# Patient Record
Sex: Female | Born: 1955 | Race: Black or African American | Hispanic: No | Marital: Married | Smoking: Never smoker
Health system: Southern US, Community
[De-identification: ages and names within clinical notes are randomized; demographics above are authoritative.]

## PROBLEM LIST (undated history)

## (undated) DIAGNOSIS — E119 Type 2 diabetes mellitus without complications: Secondary | ICD-10-CM

## (undated) DIAGNOSIS — T7840XA Allergy, unspecified, initial encounter: Secondary | ICD-10-CM

## (undated) DIAGNOSIS — I10 Essential (primary) hypertension: Secondary | ICD-10-CM

## (undated) DIAGNOSIS — E785 Hyperlipidemia, unspecified: Secondary | ICD-10-CM

## (undated) DIAGNOSIS — I8390 Asymptomatic varicose veins of unspecified lower extremity: Secondary | ICD-10-CM

## (undated) DIAGNOSIS — H3500 Unspecified background retinopathy: Secondary | ICD-10-CM

## (undated) HISTORY — PX: CATARACT EXTRACTION: SUR2

## (undated) HISTORY — DX: Essential (primary) hypertension: I10

## (undated) HISTORY — DX: Type 2 diabetes mellitus without complications: E11.9

## (undated) HISTORY — DX: Hyperlipidemia, unspecified: E78.5

## (undated) HISTORY — DX: Unspecified background retinopathy: H35.00

## (undated) HISTORY — DX: Asymptomatic varicose veins of unspecified lower extremity: I83.90

## (undated) HISTORY — DX: Allergy, unspecified, initial encounter: T78.40XA

---

## 1978-08-06 HISTORY — PX: ABDOMINAL HYSTERECTOMY: SHX81

## 2002-08-06 HISTORY — PX: WRIST FRACTURE SURGERY: SHX121

## 2005-01-15 ENCOUNTER — Ambulatory Visit: Payer: Self-pay | Admitting: Internal Medicine

## 2007-04-10 ENCOUNTER — Ambulatory Visit: Payer: Self-pay | Admitting: Internal Medicine

## 2007-06-06 ENCOUNTER — Ambulatory Visit: Payer: Self-pay | Admitting: Gastroenterology

## 2007-08-07 LAB — HM COLONOSCOPY: HM Colonoscopy: NORMAL

## 2007-12-15 ENCOUNTER — Ambulatory Visit: Payer: Self-pay | Admitting: Family Medicine

## 2010-01-31 ENCOUNTER — Ambulatory Visit: Payer: Self-pay | Admitting: Family Medicine

## 2010-03-06 LAB — HM DEXA SCAN: HM Dexa Scan: NORMAL

## 2011-02-03 ENCOUNTER — Ambulatory Visit: Payer: Self-pay | Admitting: Otolaryngology

## 2011-02-28 ENCOUNTER — Ambulatory Visit: Payer: Self-pay | Admitting: Family Medicine

## 2012-02-29 ENCOUNTER — Ambulatory Visit: Payer: Self-pay | Admitting: Family Medicine

## 2013-01-01 ENCOUNTER — Other Ambulatory Visit: Payer: Self-pay | Admitting: Family Medicine

## 2013-01-01 LAB — LIPID PANEL
Cholesterol: 187 mg/dL (ref 0–200)
HDL Cholesterol: 50 mg/dL (ref 40–60)
Ldl Cholesterol, Calc: 118 mg/dL — ABNORMAL HIGH (ref 0–100)
Triglycerides: 97 mg/dL (ref 0–200)
VLDL Cholesterol, Calc: 19 mg/dL (ref 5–40)

## 2013-01-01 LAB — COMPREHENSIVE METABOLIC PANEL
Albumin: 3.7 g/dL (ref 3.4–5.0)
Alkaline Phosphatase: 77 U/L (ref 50–136)
Anion Gap: 6 — ABNORMAL LOW (ref 7–16)
BUN: 19 mg/dL — ABNORMAL HIGH (ref 7–18)
Bilirubin,Total: 0.3 mg/dL (ref 0.2–1.0)
Calcium, Total: 9.2 mg/dL (ref 8.5–10.1)
Chloride: 107 mmol/L (ref 98–107)
Co2: 28 mmol/L (ref 21–32)
Creatinine: 0.68 mg/dL (ref 0.60–1.30)
EGFR (African American): >60
EGFR (Non-African Amer.): >60
Glucose: 76 mg/dL (ref 65–99)
Osmolality: 282 (ref 275–301)
Potassium: 3.4 mmol/L — ABNORMAL LOW (ref 3.5–5.1)
SGOT(AST): 24 U/L (ref 15–37)
SGPT (ALT): 26 U/L (ref 12–78)
Sodium: 141 mmol/L (ref 136–145)
Total Protein: 7.2 g/dL (ref 6.4–8.2)

## 2013-02-04 ENCOUNTER — Other Ambulatory Visit: Payer: Self-pay | Admitting: *Deleted

## 2013-02-04 DIAGNOSIS — I8001 Phlebitis and thrombophlebitis of superficial vessels of right lower extremity: Secondary | ICD-10-CM

## 2013-03-23 ENCOUNTER — Encounter: Payer: Self-pay | Admitting: Vascular Surgery

## 2013-03-30 ENCOUNTER — Encounter: Payer: Self-pay | Admitting: Vascular Surgery

## 2013-04-07 ENCOUNTER — Ambulatory Visit: Payer: Self-pay | Admitting: Family Medicine

## 2013-04-07 ENCOUNTER — Other Ambulatory Visit: Payer: Self-pay | Admitting: *Deleted

## 2013-04-07 DIAGNOSIS — I8003 Phlebitis and thrombophlebitis of superficial vessels of lower extremities, bilateral: Secondary | ICD-10-CM

## 2013-04-29 ENCOUNTER — Ambulatory Visit (INDEPENDENT_AMBULATORY_CARE_PROVIDER_SITE_OTHER): Payer: No Typology Code available for payment source | Admitting: Vascular Surgery

## 2013-04-29 ENCOUNTER — Encounter: Payer: Self-pay | Admitting: Vascular Surgery

## 2013-04-29 VITALS — BP 111/60 | HR 80 | Temp 98.2°F | Resp 16 | Ht 65.0 in | Wt 183.0 lb

## 2013-04-29 DIAGNOSIS — M79609 Pain in unspecified limb: Secondary | ICD-10-CM

## 2013-04-29 DIAGNOSIS — M19011 Primary osteoarthritis, right shoulder: Secondary | ICD-10-CM | POA: Insufficient documentation

## 2013-04-29 NOTE — Progress Notes (Signed)
[facility] [place]  Patient name: Patricia Chan MRN: [id number] DOB: 05-13-1956 Sex: female  REASON FOR CONSULT: right lower extremity pain  HPI: Patricia Chan is a 57 y.o. female who fell at work and injured her right lower extremity on 12/05/12. Since that time she has had some pain along the medial aspect of her right distal thigh. She had a venous duplex scan on 01/22/2013 which showed no evidence of DVT. However she did have some phlebitis in a superficial vein adjacent to the right knee. A follow up study on 02/09/2013 again showed no evidence of DVT but some persistent phlebitis along the medial distal right thigh.  She denies any previous history of DVT or phlebitis. She has taken some ibuprofen for the pain which does help. Her symptoms have gradually improved over the last several weeks. She denies any significant leg swelling. He denies chest pain or shortness of breath.  Past Medical History  Diagnosis Date  . Diabetes mellitus without complication   . Retinopathy   . Varicose veins    Family History  Problem Relation Age of Onset  . Heart disease Brother   . Diabetes Other   . Kidney disease Other    SOCIAL HISTORY: History  Substance Use Topics  . Smoking status: Never Smoker   . Smokeless tobacco: Not on file  . Alcohol Use: No   No Known Allergies  Current Outpatient Prescriptions  Medication Sig Dispense Refill  . glyBURIDE (DIABETA) 2.5 MG tablet Take 1 tablet by mouth 2 (two) times daily at 8 am and 10 pm.      . losartan-hydrochlorothiazide (HYZAAR) 100-25 MG per tablet 1 tablet daily.       . metFORMIN (GLUCOPHAGE) 850 MG tablet Take 850 mg by mouth 2 (two) times daily with a meal.      . simvastatin (ZOCOR) 40 MG tablet Take 40 mg by mouth every evening.       No current facility-administered medications for this visit.   REVIEW OF SYSTEMS: Arly.Keller ] denotes positive finding; [  ] denotes negative finding  CARDIOVASCULAR:  [ ]  chest pain    [ ]  chest pressure   [ ]  palpitations   [ ]  orthopnea   [ ]  dyspnea on exertion   [ ]  claudication   [ ]  rest pain   [ ]  DVT   [ ]  phlebitis PULMONARY:   [ ]  productive cough   [ ]  asthma   [ ]  wheezing NEUROLOGIC:   [ ]  weakness  [ ]  paresthesias  [ ]  aphasia  [ ]  amaurosis  [ ]  dizziness HEMATOLOGIC:   [ ]  bleeding problems   [ ]  clotting disorders MUSCULOSKELETAL:  [ ]  joint pain   [ ]  joint swelling [ ]  leg swelling GASTROINTESTINAL: [ ]   blood in stool  [ ]   hematemesis GENITOURINARY:  [ ]   dysuria  [ ]   hematuria PSYCHIATRIC:  [ ]  history of major depression INTEGUMENTARY:  [ ]  rashes  [ ]  ulcers CONSTITUTIONAL:  [ ]  fever   [ ]  chills  PHYSICAL EXAM: Filed Vitals:   04/29/13 1015  BP: 111/60  Pulse: 80  Temp: 98.2 F (36.8 C)  TempSrc: Oral  Resp: 16  Height: 5\' 5"  (1.651 m)  Weight: 183 lb (83.008 kg)  SpO2: 99%   Body mass index is 30.45 kg/(m^2). GENERAL: The patient is a well-nourished female, in no acute distress. The vital signs are documented above. CARDIOVASCULAR: There is a  regular rate and rhythm. I do not detect carotid bruits. She has palpable femoral, popliteal, and pedal pulses bilaterally. PULMONARY: There is good air exchange bilaterally without wheezing or rales. ABDOMEN: Soft and non-tender with normal pitched bowel sounds.  MUSCULOSKELETAL: There are no major deformities or cyanosis. NEUROLOGIC: No focal weakness or paresthesias are detected. SKIN: she has a small varicose veins on the left leg. She has some tenderness along the course of the right greater saphenous vein the distal right thigh. PSYCHIATRIC: The patient has a normal affect.  DATA:  I reviewed her venous duplex scan from 01/22/2013 and 02/09/2013. Results are described above.  I have reviewed her records from the [facility]. Her medical history is otherwise fairly unremarkable.  MEDICAL ISSUES: This patient has resolving superficial thrombophlebitis of the right thigh. I do not  think any further workup is indicated. She'll continue to take ibuprofen as needed and to use warm compresses and leg elevation as needed. I'll be happy to see her back at any time if any new vascular issues arise.  DICKSON,CHRISTOPHER S [facility] [place] Beeper: [phone]

## 2013-05-08 ENCOUNTER — Encounter: Payer: Self-pay | Admitting: Family Medicine

## 2014-01-15 ENCOUNTER — Other Ambulatory Visit: Payer: Self-pay | Admitting: Family Medicine

## 2014-01-15 LAB — COMPREHENSIVE METABOLIC PANEL
ALBUMIN: 3.9 g/dL (ref 3.4–5.0)
ALT: 23 U/L (ref 12–78)
AST: 18 U/L (ref 15–37)
Alkaline Phosphatase: 67 U/L
Anion Gap: 8 (ref 7–16)
BUN: 22 mg/dL — ABNORMAL HIGH (ref 7–18)
Bilirubin,Total: 0.3 mg/dL (ref 0.2–1.0)
CALCIUM: 9.4 mg/dL (ref 8.5–10.1)
CO2: 29 mmol/L (ref 21–32)
CREATININE: 0.76 mg/dL (ref 0.60–1.30)
Chloride: 104 mmol/L (ref 98–107)
EGFR (Non-African Amer.): >60
GLUCOSE: 106 mg/dL — AB (ref 65–99)
Osmolality: 285 (ref 275–301)
Potassium: 3.7 mmol/L (ref 3.5–5.1)
SODIUM: 141 mmol/L (ref 136–145)
TOTAL PROTEIN: 7.1 g/dL (ref 6.4–8.2)

## 2014-01-15 LAB — LIPID PANEL
CHOLESTEROL: 153 mg/dL (ref 0–200)
Cholesterol: 153 mg/dL (ref 0–200)
HDL Cholesterol: 50 mg/dL (ref 40–60)
HDL: 50 mg/dL (ref 35–70)
LDL CALC: 88 mg/dL
Ldl Cholesterol, Calc: 88 mg/dL (ref 0–100)
Triglycerides: 77 mg/dL (ref 0–200)
Triglycerides: 77 mg/dL (ref 40–160)
VLDL CHOLESTEROL, CALC: 15 mg/dL (ref 5–40)

## 2014-04-27 ENCOUNTER — Ambulatory Visit: Payer: Self-pay | Admitting: Family Medicine

## 2014-04-27 LAB — HM MAMMOGRAPHY: HM MAMMO: NORMAL

## 2014-09-08 LAB — HEMOGLOBIN A1C: HEMOGLOBIN A1C: 7.4 % — AB (ref 4.0–6.0)

## 2015-01-11 ENCOUNTER — Ambulatory Visit (INDEPENDENT_AMBULATORY_CARE_PROVIDER_SITE_OTHER): Payer: Managed Care, Other (non HMO) | Admitting: Family Medicine

## 2015-01-11 ENCOUNTER — Encounter: Payer: Self-pay | Admitting: Family Medicine

## 2015-01-11 VITALS — BP 124/70 | HR 93 | Temp 97.8°F | Resp 14 | Ht 64.0 in | Wt 172.6 lb

## 2015-01-11 DIAGNOSIS — E785 Hyperlipidemia, unspecified: Secondary | ICD-10-CM

## 2015-01-11 DIAGNOSIS — E11319 Type 2 diabetes mellitus with unspecified diabetic retinopathy without macular edema: Secondary | ICD-10-CM | POA: Insufficient documentation

## 2015-01-11 DIAGNOSIS — F325 Major depressive disorder, single episode, in full remission: Secondary | ICD-10-CM | POA: Insufficient documentation

## 2015-01-11 DIAGNOSIS — I10 Essential (primary) hypertension: Secondary | ICD-10-CM | POA: Diagnosis not present

## 2015-01-11 DIAGNOSIS — E2839 Other primary ovarian failure: Secondary | ICD-10-CM | POA: Insufficient documentation

## 2015-01-11 DIAGNOSIS — E1169 Type 2 diabetes mellitus with other specified complication: Secondary | ICD-10-CM | POA: Insufficient documentation

## 2015-01-11 DIAGNOSIS — E08319 Diabetes mellitus due to underlying condition with unspecified diabetic retinopathy without macular edema: Secondary | ICD-10-CM

## 2015-01-11 DIAGNOSIS — Z862 Personal history of diseases of the blood and blood-forming organs and certain disorders involving the immune mechanism: Secondary | ICD-10-CM

## 2015-01-11 DIAGNOSIS — E119 Type 2 diabetes mellitus without complications: Secondary | ICD-10-CM | POA: Diagnosis not present

## 2015-01-11 DIAGNOSIS — F32A Depression, unspecified: Secondary | ICD-10-CM

## 2015-01-11 DIAGNOSIS — J3089 Other allergic rhinitis: Secondary | ICD-10-CM | POA: Insufficient documentation

## 2015-01-11 DIAGNOSIS — F320 Major depressive disorder, single episode, mild: Secondary | ICD-10-CM | POA: Insufficient documentation

## 2015-01-11 DIAGNOSIS — F329 Major depressive disorder, single episode, unspecified: Secondary | ICD-10-CM

## 2015-01-11 DIAGNOSIS — E663 Overweight: Secondary | ICD-10-CM | POA: Insufficient documentation

## 2015-01-11 DIAGNOSIS — M19019 Primary osteoarthritis, unspecified shoulder: Secondary | ICD-10-CM | POA: Insufficient documentation

## 2015-01-11 LAB — POCT UA - MICROALBUMIN: Microalbumin Ur, POC: 20 mg/L

## 2015-01-11 LAB — POCT GLYCOSYLATED HEMOGLOBIN (HGB A1C): HEMOGLOBIN A1C: 7.3

## 2015-01-11 MED ORDER — ATORVASTATIN CALCIUM 40 MG PO TABS: 40.0000 mg | ORAL_TABLET | Freq: Every day | ORAL | Status: DC

## 2015-01-11 MED ORDER — INSULIN DETEMIR 100 UNIT/ML ~~LOC~~ SOLN: 10.0000 [IU] | Freq: Every evening | SUBCUTANEOUS | Status: DC

## 2015-01-11 NOTE — Patient Instructions (Signed)
Hypoglycemia °Hypoglycemia occurs when the glucose in your blood is too low. Glucose is a type of sugar that is your body's main energy source. Hormones, such as insulin and glucagon, control the level of glucose in the blood. Insulin lowers blood glucose and glucagon increases blood glucose. Having too much insulin in your blood stream, or not eating enough food containing sugar, can result in hypoglycemia. Hypoglycemia can happen to people with or without diabetes. It can develop quickly and can be a medical emergency.  °CAUSES  °· Missing or delaying meals. °· Not eating enough carbohydrates at meals. °· Taking too much diabetes medicine. °· Not timing your oral diabetes medicine or insulin doses with meals, snacks, and exercise. °· Nausea and vomiting. °· Certain medicines. °· Severe illnesses, such as hepatitis, kidney disorders, and certain eating disorders. °· Increased activity or exercise without eating something extra or adjusting medicines. °· Drinking too much alcohol. °· A nerve disorder that affects body functions like your heart rate, blood pressure, and digestion (autonomic neuropathy). °· A condition where the stomach muscles do not function properly (gastroparesis). Therefore, medicines and food may not absorb properly. °· Rarely, a tumor of the pancreas can produce too much insulin. °SYMPTOMS  °· Hunger. °· Sweating (diaphoresis). °· Change in body temperature. °· Shakiness. °· Headache. °· Anxiety. °· Lightheadedness. °· Irritability. °· Difficulty concentrating. °· Dry mouth. °· Tingling or numbness in the hands or feet. °· Restless sleep or sleep disturbances. °· Altered speech and coordination. °· Change in mental status. °· Seizures or prolonged convulsions. °· Combativeness. °· Drowsiness (lethargic). °· Weakness. °· Increased heart rate or palpitations. °· Confusion. °· Pale, gray skin color. °· Blurred or double vision. °· Fainting. °DIAGNOSIS  °A physical exam and medical history will be  performed. Your caregiver may make a diagnosis based on your symptoms. Blood tests and other lab tests may be performed to confirm a diagnosis. Once the diagnosis is made, your caregiver will see if your signs and symptoms go away once your blood glucose is raised.  °TREATMENT  °Usually, you can easily treat your hypoglycemia when you notice symptoms. °· Check your blood glucose. If it is less than 70 mg/dl, take one of the following:   °¨ 3-4 glucose tablets.   °¨ ½ cup juice.   °¨ ½ cup regular soda.   °¨ 1 cup skim milk.   °¨ ½-1 tube of glucose gel.   °¨ 5-6 hard candies.   °· Avoid high-fat drinks or food that may delay a rise in blood glucose levels. °· Do not take more than the recommended amount of sugary foods, drinks, gel, or tablets. Doing so will cause your blood glucose to go too high.   °· Wait 10-15 minutes and recheck your blood glucose. If it is still less than 70 mg/dl or below your target range, repeat treatment.   °· Eat a snack if it is more than 1 hour until your next meal.   °There may be a time when your blood glucose may go so low that you are unable to treat yourself at home when you start to notice symptoms. You may need someone to help you. You may even faint or be unable to swallow. If you cannot treat yourself, someone will need to bring you to the hospital.  °HOME CARE INSTRUCTIONS °· If you have diabetes, follow your diabetes management plan by: °¨ Taking your medicines as directed. °¨ Following your exercise plan. °¨ Following your meal plan. Do not skip meals. Eat on time. °¨ Testing your blood   glucose regularly. Check your blood glucose before and after exercise. If you exercise longer or different than usual, be sure to check blood glucose more frequently. °¨ Wearing your medical alert jewelry that says you have diabetes. °· Identify the cause of your hypoglycemia. Then, develop ways to prevent the recurrence of hypoglycemia. °· Do not take a hot bath or shower right after an  insulin shot. °· Always carry treatment with you. Glucose tablets are the easiest to carry. °· If you are going to drink alcohol, drink it only with meals. °· Tell friends or family members ways to keep you safe during a seizure. This may include removing hard or sharp objects from the area or turning you on your side. °· Maintain a healthy weight. °SEEK MEDICAL CARE IF:  °· You are having problems keeping your blood glucose in your target range. °· You are having frequent episodes of hypoglycemia. °· You feel you might be having side effects from your medicines. °· You are not sure why your blood glucose is dropping so low. °· You notice a change in vision or a new problem with your vision. °SEEK IMMEDIATE MEDICAL CARE IF:  °· Confusion develops. °· A change in mental status occurs. °· The inability to swallow develops. °· Fainting occurs. °Document Released: 07/23/2005 Document Revised: 07/28/2013 Document Reviewed: 11/19/2011 °[employer]® Patient Information ©2015 [employer]. This information is not intended to replace advice given to you by your health care provider. Make sure you discuss any questions you have with your health care provider. ° °
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
## 2015-01-27 ENCOUNTER — Other Ambulatory Visit
Admission: RE | Admit: 2015-01-27 | Discharge: 2015-01-27 | Disposition: A | Discharge status: Home / Self Care | Payer: Managed Care, Other (non HMO) | Source: Ambulatory Visit | Attending: Family Medicine | Admitting: Family Medicine

## 2015-01-27 DIAGNOSIS — I10 Essential (primary) hypertension: Secondary | ICD-10-CM | POA: Diagnosis not present

## 2015-01-27 DIAGNOSIS — Z862 Personal history of diseases of the blood and blood-forming organs and certain disorders involving the immune mechanism: Secondary | ICD-10-CM | POA: Diagnosis not present

## 2015-01-27 DIAGNOSIS — E785 Hyperlipidemia, unspecified: Secondary | ICD-10-CM | POA: Diagnosis not present

## 2015-01-27 DIAGNOSIS — E08319 Diabetes mellitus due to underlying condition with unspecified diabetic retinopathy without macular edema: Secondary | ICD-10-CM | POA: Insufficient documentation

## 2015-01-27 LAB — COMPREHENSIVE METABOLIC PANEL
ALK PHOS: 65 U/L (ref 38–126)
ALT: 18 U/L (ref 14–54)
ANION GAP: 6 (ref 5–15)
AST: 18 U/L (ref 15–41)
Albumin: 4.4 g/dL (ref 3.5–5.0)
BUN: 24 mg/dL — ABNORMAL HIGH (ref 6–20)
CHLORIDE: 105 mmol/L (ref 101–111)
CO2: 29 mmol/L (ref 22–32)
Calcium: 9.4 mg/dL (ref 8.9–10.3)
Creatinine, Ser: 0.82 mg/dL (ref 0.44–1.00)
GFR calc Af Amer: >60 mL/min (ref >60)
GFR calc non Af Amer: >60 mL/min (ref >60)
GLUCOSE: 83 mg/dL (ref 65–99)
POTASSIUM: 3.5 mmol/L (ref 3.5–5.1)
SODIUM: 140 mmol/L (ref 135–145)
Total Bilirubin: 0.3 mg/dL (ref 0.3–1.2)
Total Protein: 7.5 g/dL (ref 6.5–8.1)

## 2015-02-16 ENCOUNTER — Other Ambulatory Visit: Payer: Self-pay | Admitting: Family Medicine

## 2015-02-16 NOTE — Telephone Encounter (Signed)
Patient requesting refill. 

## 2015-03-24 ENCOUNTER — Other Ambulatory Visit: Payer: Self-pay | Admitting: Family Medicine

## 2015-04-08 ENCOUNTER — Other Ambulatory Visit: Payer: Self-pay | Admitting: Family Medicine

## 2015-04-08 NOTE — Telephone Encounter (Signed)
Patient requesting refill. 

## 2015-04-11 ENCOUNTER — Other Ambulatory Visit: Payer: Self-pay | Admitting: Family Medicine

## 2015-04-14 ENCOUNTER — Encounter: Payer: Self-pay | Admitting: Family Medicine

## 2015-04-14 ENCOUNTER — Ambulatory Visit (INDEPENDENT_AMBULATORY_CARE_PROVIDER_SITE_OTHER): Payer: Managed Care, Other (non HMO) | Admitting: Family Medicine

## 2015-04-14 VITALS — BP 128/68 | HR 84 | Temp 97.7°F | Resp 16 | Ht 64.0 in | Wt 170.2 lb

## 2015-04-14 DIAGNOSIS — M25569 Pain in unspecified knee: Secondary | ICD-10-CM

## 2015-04-14 DIAGNOSIS — I10 Essential (primary) hypertension: Secondary | ICD-10-CM

## 2015-04-14 DIAGNOSIS — Z862 Personal history of diseases of the blood and blood-forming organs and certain disorders involving the immune mechanism: Secondary | ICD-10-CM | POA: Diagnosis not present

## 2015-04-14 DIAGNOSIS — E11329 Type 2 diabetes mellitus with mild nonproliferative diabetic retinopathy without macular edema: Secondary | ICD-10-CM | POA: Diagnosis not present

## 2015-04-14 DIAGNOSIS — E113299 Type 2 diabetes mellitus with mild nonproliferative diabetic retinopathy without macular edema, unspecified eye: Secondary | ICD-10-CM

## 2015-04-14 DIAGNOSIS — Z23 Encounter for immunization: Secondary | ICD-10-CM

## 2015-04-14 DIAGNOSIS — H6123 Impacted cerumen, bilateral: Secondary | ICD-10-CM | POA: Diagnosis not present

## 2015-04-14 DIAGNOSIS — E785 Hyperlipidemia, unspecified: Secondary | ICD-10-CM | POA: Diagnosis not present

## 2015-04-14 DIAGNOSIS — J309 Allergic rhinitis, unspecified: Secondary | ICD-10-CM

## 2015-04-14 DIAGNOSIS — G8929 Other chronic pain: Secondary | ICD-10-CM | POA: Diagnosis not present

## 2015-04-14 DIAGNOSIS — M25562 Pain in left knee: Secondary | ICD-10-CM | POA: Diagnosis not present

## 2015-04-14 DIAGNOSIS — J3089 Other allergic rhinitis: Secondary | ICD-10-CM

## 2015-04-14 LAB — POCT GLYCOSYLATED HEMOGLOBIN (HGB A1C): Hemoglobin A1C: 7

## 2015-04-14 MED ORDER — GLYBURIDE 2.5 MG PO TABS
2.5000 mg | ORAL_TABLET | Freq: Two times a day (BID) | ORAL | Status: DC
Start: 2015-04-14 — End: 2015-08-23

## 2015-04-14 MED ORDER — ACETAMINOPHEN 500 MG PO TABS: 500.0000 mg | ORAL_TABLET | Freq: Three times a day (TID) | ORAL | Status: AC | PRN

## 2015-04-14 MED ORDER — CANAGLIFLOZIN-METFORMIN HCL 150-1000 MG PO TABS
1.0000 | ORAL_TABLET | Freq: Two times a day (BID) | ORAL | Status: DC
Start: 2015-04-14 — End: 2015-05-16

## 2015-04-14 MED ORDER — INSULIN DETEMIR 100 UNIT/ML ~~LOC~~ SOLN: 10.0000 [IU] | Freq: Every evening | SUBCUTANEOUS | Status: DC

## 2015-04-14 MED ORDER — ATORVASTATIN CALCIUM 40 MG PO TABS
40.0000 mg | ORAL_TABLET | Freq: Every day | ORAL | Status: DC
Start: 2015-04-14 — End: 2015-08-23

## 2015-04-14 MED ORDER — LORATADINE 10 MG PO TABS: 10.0000 mg | ORAL_TABLET | Freq: Every day | ORAL | Status: DC

## 2015-04-14 MED ORDER — FLUTICASONE PROPIONATE 50 MCG/ACT NA SUSP: 2.0000 | Freq: Every day | NASAL | Status: DC

## 2015-04-14 MED ORDER — LOSARTAN POTASSIUM-HCTZ 100-25 MG PO TABS: 1.0000 | ORAL_TABLET | Freq: Every morning | ORAL | Status: DC
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
## 2015-04-21 ENCOUNTER — Other Ambulatory Visit: Payer: Self-pay | Admitting: Family Medicine

## 2015-04-21 NOTE — Telephone Encounter (Signed)
Patient requesting refill. 

## 2015-05-11 ENCOUNTER — Other Ambulatory Visit: Payer: Self-pay | Admitting: Family Medicine

## 2015-05-12 NOTE — Telephone Encounter (Signed)
Appointment made for monday

## 2015-05-16 ENCOUNTER — Encounter: Payer: Self-pay | Admitting: Family Medicine

## 2015-05-16 ENCOUNTER — Ambulatory Visit
Admission: RE | Admit: 2015-05-16 | Discharge: 2015-05-16 | Disposition: A | Discharge status: Home / Self Care | Payer: Managed Care, Other (non HMO) | Source: Ambulatory Visit | Attending: Family Medicine | Admitting: Family Medicine

## 2015-05-16 ENCOUNTER — Ambulatory Visit (INDEPENDENT_AMBULATORY_CARE_PROVIDER_SITE_OTHER): Payer: Managed Care, Other (non HMO) | Admitting: Family Medicine

## 2015-05-16 VITALS — BP 114/62 | HR 99 | Temp 98.1°F | Resp 18 | Ht 64.0 in | Wt 169.6 lb

## 2015-05-16 DIAGNOSIS — M25562 Pain in left knee: Principal | ICD-10-CM

## 2015-05-16 DIAGNOSIS — G8929 Other chronic pain: Secondary | ICD-10-CM | POA: Insufficient documentation

## 2015-05-16 DIAGNOSIS — M1712 Unilateral primary osteoarthritis, left knee: Secondary | ICD-10-CM | POA: Diagnosis not present

## 2015-05-16 DIAGNOSIS — F320 Major depressive disorder, single episode, mild: Secondary | ICD-10-CM | POA: Diagnosis not present

## 2015-05-16 MED ORDER — TRAMADOL HCL 50 MG PO TABS: 50.0000 mg | ORAL_TABLET | Freq: Every evening | ORAL | Status: DC | PRN
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
## 2015-05-20 ENCOUNTER — Other Ambulatory Visit
Admission: RE | Admit: 2015-05-20 | Discharge: 2015-05-20 | Disposition: A | Discharge status: Home / Self Care | Payer: Managed Care, Other (non HMO) | Source: Ambulatory Visit | Attending: Family Medicine | Admitting: Family Medicine

## 2015-05-20 DIAGNOSIS — M25562 Pain in left knee: Secondary | ICD-10-CM | POA: Insufficient documentation

## 2015-05-20 LAB — LIPID PANEL
CHOL/HDL RATIO: 3.2 ratio
CHOLESTEROL: 150 mg/dL (ref 0–200)
HDL: 47 mg/dL (ref >40)
LDL CALC: 90 mg/dL (ref 0–99)
TRIGLYCERIDES: 63 mg/dL (ref <150)
VLDL: 13 mg/dL (ref 0–40)

## 2015-05-20 LAB — COMPREHENSIVE METABOLIC PANEL
ALBUMIN: 4.7 g/dL (ref 3.5–5.0)
ALK PHOS: 58 U/L (ref 38–126)
ALT: 23 U/L (ref 14–54)
ANION GAP: 9 (ref 5–15)
AST: 18 U/L (ref 15–41)
BUN: 25 mg/dL — ABNORMAL HIGH (ref 6–20)
CALCIUM: 9.8 mg/dL (ref 8.9–10.3)
CHLORIDE: 104 mmol/L (ref 101–111)
CO2: 28 mmol/L (ref 22–32)
Creatinine, Ser: 0.72 mg/dL (ref 0.44–1.00)
GFR calc non Af Amer: >60 mL/min (ref >60)
GLUCOSE: 125 mg/dL — AB (ref 65–99)
Potassium: 3.6 mmol/L (ref 3.5–5.1)
SODIUM: 141 mmol/L (ref 135–145)
Total Bilirubin: 0.3 mg/dL (ref 0.3–1.2)
Total Protein: 7.6 g/dL (ref 6.5–8.1)

## 2015-05-20 LAB — CBC WITH DIFFERENTIAL/PLATELET
BASOS ABS: 0.1 10*3/uL (ref 0–0.1)
BASOS PCT: 1 %
EOS ABS: 0.4 10*3/uL (ref 0–0.7)
EOS PCT: 7 %
HCT: 35.6 % (ref 35.0–47.0)
Hemoglobin: 12.1 g/dL (ref 12.0–16.0)
Lymphocytes Relative: 24 %
Lymphs Abs: 1.4 10*3/uL (ref 1.0–3.6)
MCH: 29.9 pg (ref 26.0–34.0)
MCHC: 34.1 g/dL (ref 32.0–36.0)
MCV: 87.8 fL (ref 80.0–100.0)
MONO ABS: 0.5 10*3/uL (ref 0.2–0.9)
Monocytes Relative: 9 %
Neutro Abs: 3.5 10*3/uL (ref 1.4–6.5)
Neutrophils Relative %: 59 %
PLATELETS: 302 10*3/uL (ref 150–440)
RBC: 4.05 MIL/uL (ref 3.80–5.20)
RDW: 14.2 % (ref 11.5–14.5)
WBC: 5.8 10*3/uL (ref 3.6–11.0)

## 2015-05-20 LAB — HEMOGLOBIN A1C: Hgb A1c MFr Bld: 7 % — ABNORMAL HIGH (ref 4.0–6.0)

## 2015-06-03 ENCOUNTER — Other Ambulatory Visit: Payer: Self-pay | Admitting: Family Medicine

## 2015-06-03 NOTE — Telephone Encounter (Signed)
Patient requesting refill. 

## 2015-07-28 ENCOUNTER — Ambulatory Visit (INDEPENDENT_AMBULATORY_CARE_PROVIDER_SITE_OTHER): Payer: 59 | Admitting: Family Medicine

## 2015-07-28 ENCOUNTER — Encounter: Payer: Self-pay | Admitting: Family Medicine

## 2015-07-28 VITALS — BP 112/66 | HR 89 | Temp 98.0°F | Resp 18 | Wt 176.0 lb

## 2015-07-28 DIAGNOSIS — Z794 Long term (current) use of insulin: Secondary | ICD-10-CM

## 2015-07-28 DIAGNOSIS — G8929 Other chronic pain: Secondary | ICD-10-CM | POA: Diagnosis not present

## 2015-07-28 DIAGNOSIS — E118 Type 2 diabetes mellitus with unspecified complications: Secondary | ICD-10-CM | POA: Diagnosis not present

## 2015-07-28 DIAGNOSIS — M25562 Pain in left knee: Secondary | ICD-10-CM

## 2015-07-28 DIAGNOSIS — B3731 Acute candidiasis of vulva and vagina: Secondary | ICD-10-CM

## 2015-07-28 DIAGNOSIS — M1712 Unilateral primary osteoarthritis, left knee: Secondary | ICD-10-CM | POA: Diagnosis not present

## 2015-07-28 DIAGNOSIS — N907 Vulvar cyst: Secondary | ICD-10-CM | POA: Diagnosis not present

## 2015-07-28 DIAGNOSIS — B373 Candidiasis of vulva and vagina: Secondary | ICD-10-CM

## 2015-07-28 DIAGNOSIS — Z23 Encounter for immunization: Secondary | ICD-10-CM

## 2015-07-28 MED ORDER — TRIAMCINOLONE ACETONIDE 40 MG/ML IJ SUSP
40.0000 mg | Freq: Once | INTRAMUSCULAR | Status: AC
  Administered 2015-07-28: 40 mg via INTRAMUSCULAR

## 2015-07-28 MED ORDER — FLUCONAZOLE 150 MG PO TABS: 150.0000 mg | ORAL_TABLET | ORAL | Status: DC

## 2015-07-28 MED ORDER — LIDOCAINE HCL (PF) 1 % IJ SOLN
2.0000 mL | Freq: Once | INTRAMUSCULAR | Status: AC
  Administered 2015-07-28: 2 mL via INTRADERMAL
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
## 2015-08-12 ENCOUNTER — Telehealth: Payer: Self-pay

## 2015-08-12 NOTE — Telephone Encounter (Signed)
Patient will be in on 08/23/15 at 3pm and is interested in the Levemir samples. She stated that if we get any in before then to save her 2 boxes.

## 2015-08-15 ENCOUNTER — Ambulatory Visit: Payer: 59 | Admitting: Family Medicine

## 2015-08-18 ENCOUNTER — Telehealth: Payer: Self-pay | Admitting: Family Medicine

## 2015-08-18 NOTE — Telephone Encounter (Signed)
Pens are in the fridge waiting for her to pick it up.

## 2015-08-18 NOTE — Telephone Encounter (Signed)
Patient will be completely out of Levimer. Was told that when we received either the levimer pen or the valves that we would save it for her. She will need a prescription for the needles if we give her the valves. Would like to pick this up today around 2p

## 2015-08-18 NOTE — Telephone Encounter (Signed)
Patient informed and will pick it up this afternoon

## 2015-08-23 ENCOUNTER — Encounter: Payer: Self-pay | Admitting: Family Medicine

## 2015-08-23 ENCOUNTER — Ambulatory Visit (INDEPENDENT_AMBULATORY_CARE_PROVIDER_SITE_OTHER): Payer: BLUE CROSS/BLUE SHIELD | Admitting: Family Medicine

## 2015-08-23 VITALS — BP 114/70 | HR 91 | Temp 97.9°F | Resp 16 | Wt 178.1 lb

## 2015-08-23 DIAGNOSIS — E113219 Type 2 diabetes mellitus with mild nonproliferative diabetic retinopathy with macular edema, unspecified eye: Secondary | ICD-10-CM | POA: Diagnosis not present

## 2015-08-23 DIAGNOSIS — E113299 Type 2 diabetes mellitus with mild nonproliferative diabetic retinopathy without macular edema, unspecified eye: Secondary | ICD-10-CM | POA: Diagnosis not present

## 2015-08-23 DIAGNOSIS — I10 Essential (primary) hypertension: Secondary | ICD-10-CM | POA: Diagnosis not present

## 2015-08-23 DIAGNOSIS — E1165 Type 2 diabetes mellitus with hyperglycemia: Secondary | ICD-10-CM | POA: Diagnosis not present

## 2015-08-23 DIAGNOSIS — E785 Hyperlipidemia, unspecified: Secondary | ICD-10-CM | POA: Diagnosis not present

## 2015-08-23 DIAGNOSIS — J309 Allergic rhinitis, unspecified: Secondary | ICD-10-CM

## 2015-08-23 DIAGNOSIS — Z794 Long term (current) use of insulin: Secondary | ICD-10-CM

## 2015-08-23 DIAGNOSIS — IMO0002 Reserved for concepts with insufficient information to code with codable children: Secondary | ICD-10-CM

## 2015-08-23 DIAGNOSIS — J3089 Other allergic rhinitis: Secondary | ICD-10-CM

## 2015-08-23 LAB — POCT UA - MICROALBUMIN: Microalbumin Ur, POC: 20 mg/L

## 2015-08-23 LAB — POCT GLYCOSYLATED HEMOGLOBIN (HGB A1C): HEMOGLOBIN A1C: 7.8

## 2015-08-23 MED ORDER — INSULIN DETEMIR 100 UNIT/ML FLEXPEN: 10.0000 [IU] | PEN_INJECTOR | Freq: Every day | SUBCUTANEOUS | Status: DC

## 2015-08-23 MED ORDER — FLUTICASONE PROPIONATE 50 MCG/ACT NA SUSP: 2.0000 | Freq: Every day | NASAL | Status: DC

## 2015-08-23 MED ORDER — LOSARTAN POTASSIUM-HCTZ 100-25 MG PO TABS: 1.0000 | ORAL_TABLET | Freq: Every morning | ORAL | Status: DC

## 2015-08-23 MED ORDER — ATORVASTATIN CALCIUM 40 MG PO TABS: 40.0000 mg | ORAL_TABLET | Freq: Every day | ORAL | Status: DC

## 2015-08-23 MED ORDER — GLIPIZIDE 5 MG PO TABS: 2.5000 mg | ORAL_TABLET | Freq: Two times a day (BID) | ORAL | Status: DC

## 2015-08-23 MED ORDER — CANAGLIFLOZIN-METFORMIN HCL 150-1000 MG PO TABS: 1.0000 | ORAL_TABLET | Freq: Two times a day (BID) | ORAL | Status: DC
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
## 2015-10-19 ENCOUNTER — Other Ambulatory Visit: Payer: Self-pay

## 2015-10-19 DIAGNOSIS — E113299 Type 2 diabetes mellitus with mild nonproliferative diabetic retinopathy without macular edema, unspecified eye: Secondary | ICD-10-CM

## 2015-10-19 NOTE — Telephone Encounter (Signed)
Got a fax from [facility] stating that the rx for Invokamet 150-1000mg  was filled on 10/18/2015.

## 2015-11-22 ENCOUNTER — Encounter: Payer: Self-pay | Admitting: Family Medicine

## 2015-11-22 ENCOUNTER — Ambulatory Visit (INDEPENDENT_AMBULATORY_CARE_PROVIDER_SITE_OTHER): Payer: BLUE CROSS/BLUE SHIELD | Admitting: Family Medicine

## 2015-11-22 VITALS — HR 96 | Temp 98.0°F | Resp 16 | Wt 177.7 lb

## 2015-11-22 DIAGNOSIS — G8929 Other chronic pain: Secondary | ICD-10-CM

## 2015-11-22 DIAGNOSIS — M25562 Pain in left knee: Secondary | ICD-10-CM

## 2015-11-22 DIAGNOSIS — Z794 Long term (current) use of insulin: Secondary | ICD-10-CM | POA: Diagnosis not present

## 2015-11-22 DIAGNOSIS — I10 Essential (primary) hypertension: Secondary | ICD-10-CM | POA: Diagnosis not present

## 2015-11-22 DIAGNOSIS — J309 Allergic rhinitis, unspecified: Secondary | ICD-10-CM | POA: Diagnosis not present

## 2015-11-22 DIAGNOSIS — M1712 Unilateral primary osteoarthritis, left knee: Secondary | ICD-10-CM

## 2015-11-22 DIAGNOSIS — E113219 Type 2 diabetes mellitus with mild nonproliferative diabetic retinopathy with macular edema, unspecified eye: Secondary | ICD-10-CM | POA: Diagnosis not present

## 2015-11-22 DIAGNOSIS — J3089 Other allergic rhinitis: Secondary | ICD-10-CM

## 2015-11-22 LAB — POCT GLYCOSYLATED HEMOGLOBIN (HGB A1C): HEMOGLOBIN A1C: 8.2

## 2015-11-22 MED ORDER — DULAGLUTIDE 1.5 MG/0.5ML ~~LOC~~ SOAJ: 1.5000 mg | SUBCUTANEOUS | Status: DC

## 2015-11-22 MED ORDER — CANAGLIFLOZIN-METFORMIN HCL 150-1000 MG PO TABS: 1.0000 | ORAL_TABLET | Freq: Two times a day (BID) | ORAL | Status: DC

## 2015-11-22 MED ORDER — INSULIN DEGLUDEC 200 UNIT/ML ~~LOC~~ SOPN: 12.0000 [IU] | PEN_INJECTOR | Freq: Every day | SUBCUTANEOUS | Status: DC
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
## 2015-12-05 ENCOUNTER — Telehealth: Payer: Self-pay | Admitting: Family Medicine

## 2015-12-05 NOTE — Telephone Encounter (Signed)
Pt would like a call back by the end of the day. Pt states its very important.

## 2015-12-06 NOTE — Telephone Encounter (Signed)
Tiffany spoke with the patient and she will come by to get a sample until she can get her meds.

## 2015-12-09 ENCOUNTER — Telehealth: Payer: Self-pay

## 2015-12-09 NOTE — Telephone Encounter (Signed)
[employer] Pre-Authorization for Trulicity was denied due to Insurance wanting patient to try Victoza first.

## 2015-12-11 MED ORDER — LIRAGLUTIDE 18 MG/3ML ~~LOC~~ SOPN: 1.2000 mg | PEN_INJECTOR | Freq: Every day | SUBCUTANEOUS | Status: DC

## 2015-12-11 NOTE — Telephone Encounter (Signed)
Change to Victoza,

## 2015-12-12 NOTE — Telephone Encounter (Signed)
Patient notified

## 2015-12-23 ENCOUNTER — Encounter: Payer: Self-pay | Admitting: Family Medicine

## 2015-12-23 ENCOUNTER — Ambulatory Visit (INDEPENDENT_AMBULATORY_CARE_PROVIDER_SITE_OTHER): Payer: BLUE CROSS/BLUE SHIELD | Admitting: Family Medicine

## 2015-12-23 VITALS — BP 112/76 | HR 101 | Temp 97.5°F | Resp 16 | Ht 64.0 in | Wt 172.8 lb

## 2015-12-23 DIAGNOSIS — E113219 Type 2 diabetes mellitus with mild nonproliferative diabetic retinopathy with macular edema, unspecified eye: Secondary | ICD-10-CM

## 2015-12-23 DIAGNOSIS — L309 Dermatitis, unspecified: Secondary | ICD-10-CM | POA: Diagnosis not present

## 2015-12-23 DIAGNOSIS — Z794 Long term (current) use of insulin: Secondary | ICD-10-CM | POA: Diagnosis not present

## 2015-12-23 MED ORDER — INSULIN DEGLUDEC-LIRAGLUTIDE 100-3.6 UNIT-MG/ML ~~LOC~~ SOPN: 16.0000 [IU] | PEN_INJECTOR | Freq: Every day | SUBCUTANEOUS | Status: DC

## 2015-12-23 MED ORDER — TRIAMCINOLONE ACETONIDE 0.1 % EX CREA: 1.0000 "application " | TOPICAL_CREAM | Freq: Two times a day (BID) | CUTANEOUS | Status: DC
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
## 2016-01-24 ENCOUNTER — Other Ambulatory Visit: Payer: Self-pay

## 2016-01-24 ENCOUNTER — Telehealth: Payer: Self-pay | Admitting: Family Medicine

## 2016-01-24 DIAGNOSIS — E785 Hyperlipidemia, unspecified: Secondary | ICD-10-CM

## 2016-01-24 MED ORDER — ATORVASTATIN CALCIUM 40 MG PO TABS: 40.0000 mg | ORAL_TABLET | Freq: Every day | ORAL | Status: DC

## 2016-01-24 NOTE — Telephone Encounter (Signed)
One box only

## 2016-01-24 NOTE — Telephone Encounter (Signed)
Would like to know if we have any samples of xultophy. She is about out and does not have the funds this week to request a prescription. Would like to pick up this afternoon

## 2016-01-24 NOTE — Telephone Encounter (Signed)
Refill request was sent to Dr. Krichna Sowles for approval and submission.  

## 2016-01-24 NOTE — Telephone Encounter (Signed)
We have 6 boxes, how many would you like her to have?

## 2016-01-27 ENCOUNTER — Telehealth: Payer: Self-pay | Admitting: Family Medicine

## 2016-01-27 DIAGNOSIS — I10 Essential (primary) hypertension: Secondary | ICD-10-CM

## 2016-01-27 NOTE — Telephone Encounter (Signed)
Patient requesting refill. 

## 2016-01-27 NOTE — Telephone Encounter (Signed)
Pt states she needs refill on Losartan to be sent to [facility].

## 2016-01-28 MED ORDER — LOSARTAN POTASSIUM-HCTZ 100-25 MG PO TABS
1.0000 | ORAL_TABLET | Freq: Every morning | ORAL | Status: DC
Start: 2016-01-28 — End: 2016-08-07

## 2016-02-20 ENCOUNTER — Other Ambulatory Visit: Payer: Self-pay

## 2016-02-20 DIAGNOSIS — E113219 Type 2 diabetes mellitus with mild nonproliferative diabetic retinopathy with macular edema, unspecified eye: Secondary | ICD-10-CM

## 2016-02-20 DIAGNOSIS — Z794 Long term (current) use of insulin: Principal | ICD-10-CM

## 2016-02-20 MED ORDER — INSULIN DEGLUDEC-LIRAGLUTIDE 100-3.6 UNIT-MG/ML ~~LOC~~ SOPN: 16.0000 [IU] | PEN_INJECTOR | Freq: Every day | SUBCUTANEOUS | Status: DC

## 2016-02-20 NOTE — Telephone Encounter (Signed)
Patient stated she needs a new prescription sent to [facility] since she was finally able to get the coupon card to go through.

## 2016-02-22 ENCOUNTER — Ambulatory Visit: Payer: BLUE CROSS/BLUE SHIELD | Admitting: Family Medicine

## 2016-03-05 ENCOUNTER — Ambulatory Visit (INDEPENDENT_AMBULATORY_CARE_PROVIDER_SITE_OTHER): Payer: BLUE CROSS/BLUE SHIELD | Admitting: Family Medicine

## 2016-03-05 ENCOUNTER — Encounter: Payer: Self-pay | Admitting: Family Medicine

## 2016-03-05 VITALS — BP 114/64 | HR 98 | Temp 97.8°F | Resp 18 | Ht 64.0 in | Wt 174.8 lb

## 2016-03-05 DIAGNOSIS — J309 Allergic rhinitis, unspecified: Secondary | ICD-10-CM | POA: Diagnosis not present

## 2016-03-05 DIAGNOSIS — E785 Hyperlipidemia, unspecified: Secondary | ICD-10-CM | POA: Diagnosis not present

## 2016-03-05 DIAGNOSIS — I10 Essential (primary) hypertension: Secondary | ICD-10-CM

## 2016-03-05 DIAGNOSIS — Z794 Long term (current) use of insulin: Secondary | ICD-10-CM

## 2016-03-05 DIAGNOSIS — R0683 Snoring: Secondary | ICD-10-CM | POA: Diagnosis not present

## 2016-03-05 DIAGNOSIS — E113219 Type 2 diabetes mellitus with mild nonproliferative diabetic retinopathy with macular edema, unspecified eye: Secondary | ICD-10-CM | POA: Diagnosis not present

## 2016-03-05 DIAGNOSIS — J3089 Other allergic rhinitis: Secondary | ICD-10-CM

## 2016-03-05 LAB — POCT GLYCOSYLATED HEMOGLOBIN (HGB A1C): HEMOGLOBIN A1C: 8

## 2016-03-05 MED ORDER — ATORVASTATIN CALCIUM 40 MG PO TABS: 40.0000 mg | ORAL_TABLET | Freq: Every day | ORAL | 0 refills | Status: DC

## 2016-03-05 MED ORDER — DAPAGLIFLOZIN PRO-METFORMIN ER 10-1000 MG PO TB24: 1.0000 | ORAL_TABLET | Freq: Every day | ORAL | 3 refills | Status: DC

## 2016-03-05 NOTE — Patient Instructions (Signed)
Go up by 2 units of Xultophy every 2 days to keep fasting sugar between 90-120's
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
## 2016-03-07 ENCOUNTER — Encounter: Payer: Self-pay | Admitting: Family Medicine

## 2016-03-20 ENCOUNTER — Encounter: Payer: BLUE CROSS/BLUE SHIELD | Admitting: Family Medicine

## 2016-03-22 ENCOUNTER — Encounter: Payer: Self-pay | Admitting: Family Medicine

## 2016-05-01 ENCOUNTER — Other Ambulatory Visit: Payer: Self-pay | Admitting: Family Medicine

## 2016-05-01 DIAGNOSIS — E785 Hyperlipidemia, unspecified: Secondary | ICD-10-CM

## 2016-05-01 NOTE — Telephone Encounter (Signed)
Patient requesting refill of Atorvastatin.   

## 2016-05-28 ENCOUNTER — Other Ambulatory Visit: Payer: Self-pay | Admitting: Family Medicine

## 2016-05-28 DIAGNOSIS — E113219 Type 2 diabetes mellitus with mild nonproliferative diabetic retinopathy with macular edema, unspecified eye: Secondary | ICD-10-CM

## 2016-05-28 DIAGNOSIS — Z794 Long term (current) use of insulin: Principal | ICD-10-CM

## 2016-05-28 NOTE — Telephone Encounter (Signed)
Patient requesting refill of Xultophy to [facility].

## 2016-05-30 ENCOUNTER — Telehealth: Payer: Self-pay | Admitting: Family Medicine

## 2016-05-30 NOTE — Telephone Encounter (Signed)
Please verify . Not sure what her insurance will cover

## 2016-05-30 NOTE — Telephone Encounter (Signed)
Pt says that pharm has sent over a prior auth on Xigvuo 10mg /1000 2 x they tell her and she says that they have not heard anything back yet. She is needing this med for she is out.  Pharm is [facility].

## 2016-05-30 NOTE — Telephone Encounter (Signed)
It was denied but Patricia Chan will retry and see if it gets approved. Patricia Chan has notified patient.

## 2016-05-31 NOTE — Telephone Encounter (Signed)
I contacted [facility] and they said that this patient's rx for Xultophy was approved but the rx for Merleen Nicely was denied because it is a plan exclusion meaning that it is not covered at all.   The only way that it might get approved is if the physician does an appeal and fax it to [phone].  (marked as URGENT).  Please advise so I can contact the patient.

## 2016-06-05 ENCOUNTER — Ambulatory Visit (INDEPENDENT_AMBULATORY_CARE_PROVIDER_SITE_OTHER): Payer: Commercial Managed Care - PPO | Admitting: Family Medicine

## 2016-06-05 ENCOUNTER — Encounter: Payer: Self-pay | Admitting: Family Medicine

## 2016-06-05 VITALS — BP 138/70 | HR 96 | Temp 98.1°F | Ht 64.0 in | Wt 171.0 lb

## 2016-06-05 DIAGNOSIS — I10 Essential (primary) hypertension: Secondary | ICD-10-CM | POA: Diagnosis not present

## 2016-06-05 DIAGNOSIS — F320 Major depressive disorder, single episode, mild: Secondary | ICD-10-CM

## 2016-06-05 DIAGNOSIS — E1169 Type 2 diabetes mellitus with other specified complication: Secondary | ICD-10-CM

## 2016-06-05 DIAGNOSIS — R5383 Other fatigue: Secondary | ICD-10-CM

## 2016-06-05 DIAGNOSIS — R0683 Snoring: Secondary | ICD-10-CM

## 2016-06-05 DIAGNOSIS — E113213 Type 2 diabetes mellitus with mild nonproliferative diabetic retinopathy with macular edema, bilateral: Secondary | ICD-10-CM

## 2016-06-05 DIAGNOSIS — Z862 Personal history of diseases of the blood and blood-forming organs and certain disorders involving the immune mechanism: Secondary | ICD-10-CM | POA: Diagnosis not present

## 2016-06-05 DIAGNOSIS — M1712 Unilateral primary osteoarthritis, left knee: Secondary | ICD-10-CM | POA: Diagnosis not present

## 2016-06-05 DIAGNOSIS — E1159 Type 2 diabetes mellitus with other circulatory complications: Secondary | ICD-10-CM

## 2016-06-05 DIAGNOSIS — E119 Type 2 diabetes mellitus without complications: Secondary | ICD-10-CM | POA: Diagnosis not present

## 2016-06-05 DIAGNOSIS — H6123 Impacted cerumen, bilateral: Secondary | ICD-10-CM | POA: Diagnosis not present

## 2016-06-05 DIAGNOSIS — E785 Hyperlipidemia, unspecified: Secondary | ICD-10-CM

## 2016-06-05 DIAGNOSIS — Z794 Long term (current) use of insulin: Secondary | ICD-10-CM

## 2016-06-05 DIAGNOSIS — L0293 Carbuncle, unspecified: Secondary | ICD-10-CM

## 2016-06-05 DIAGNOSIS — E669 Obesity, unspecified: Secondary | ICD-10-CM | POA: Insufficient documentation

## 2016-06-05 LAB — COMPLETE METABOLIC PANEL WITH GFR
ALBUMIN: 4.4 g/dL (ref 3.6–5.1)
ALK PHOS: 83 U/L (ref 33–130)
ALT: 17 U/L (ref 6–29)
AST: 17 U/L (ref 10–35)
BILIRUBIN TOTAL: 0.4 mg/dL (ref 0.2–1.2)
BUN: 22 mg/dL (ref 7–25)
CO2: 29 mmol/L (ref 20–31)
Calcium: 9.8 mg/dL (ref 8.6–10.4)
Chloride: 101 mmol/L (ref 98–110)
Creat: 0.9 mg/dL (ref 0.50–0.99)
GFR, Est African American: 80 mL/min (ref >=60)
GFR, Est Non African American: 69 mL/min (ref >=60)
GLUCOSE: 129 mg/dL — AB (ref 65–99)
Potassium: 3.4 mmol/L — ABNORMAL LOW (ref 3.5–5.3)
SODIUM: 140 mmol/L (ref 135–146)
TOTAL PROTEIN: 7 g/dL (ref 6.1–8.1)

## 2016-06-05 LAB — CBC WITH DIFFERENTIAL/PLATELET
BASOS ABS: 62 {cells}/uL (ref 0–200)
Basophils Relative: 1 %
EOS ABS: 248 {cells}/uL (ref 15–500)
Eosinophils Relative: 4 %
HCT: 38.7 % (ref 35.0–45.0)
HEMOGLOBIN: 13 g/dL (ref 11.7–15.5)
LYMPHS ABS: 1922 {cells}/uL (ref 850–3900)
Lymphocytes Relative: 31 %
MCH: 29.4 pg (ref 27.0–33.0)
MCHC: 33.6 g/dL (ref 32.0–36.0)
MCV: 87.6 fL (ref 80.0–100.0)
MPV: 9.7 fL (ref 7.5–12.5)
Monocytes Absolute: 496 cells/uL (ref 200–950)
Monocytes Relative: 8 %
NEUTROS ABS: 3472 {cells}/uL (ref 1500–7800)
NEUTROS PCT: 56 %
Platelets: 321 10*3/uL (ref 140–400)
RBC: 4.42 MIL/uL (ref 3.80–5.10)
RDW: 14 % (ref 11.0–15.0)
WBC: 6.2 10*3/uL (ref 3.8–10.8)

## 2016-06-05 LAB — LIPID PANEL
CHOL/HDL RATIO: 3.2 ratio (ref <=5.0)
Cholesterol: 143 mg/dL (ref 125–200)
HDL: 45 mg/dL — ABNORMAL LOW (ref >=46)
LDL Cholesterol: 78 mg/dL (ref <130)
Triglycerides: 98 mg/dL (ref <150)
VLDL: 20 mg/dL (ref <30)

## 2016-06-05 LAB — TSH: TSH: 2.01 mIU/L

## 2016-06-05 MED ORDER — METFORMIN HCL ER 500 MG PO TB24: 500.0000 mg | ORAL_TABLET | Freq: Every day | ORAL | 2 refills | Status: DC

## 2016-06-05 MED ORDER — INSULIN DEGLUDEC-LIRAGLUTIDE 100-3.6 UNIT-MG/ML ~~LOC~~ SOPN: 50.0000 [IU] | PEN_INJECTOR | Freq: Every day | SUBCUTANEOUS | 2 refills | Status: DC

## 2016-06-05 NOTE — Patient Instructions (Addendum)
Go up by two units every two days to keep fasting glucose between 90-140 fasting, maximum dose is 50 units daily  Start metformin at night , one pill for the first week and increase to two daily after that
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
## 2016-06-06 ENCOUNTER — Telehealth: Payer: Self-pay

## 2016-06-06 ENCOUNTER — Other Ambulatory Visit: Payer: Self-pay | Admitting: Family Medicine

## 2016-06-06 LAB — VITAMIN D 25 HYDROXY (VIT D DEFICIENCY, FRACTURES): Vit D, 25-Hydroxy: 19 ng/mL — ABNORMAL LOW (ref 30–100)

## 2016-06-06 LAB — VITAMIN B12: VITAMIN B 12: 399 pg/mL (ref 200–1100)

## 2016-06-06 MED ORDER — VITAMIN D (ERGOCALCIFEROL) 1.25 MG (50000 UNIT) PO CAPS
50000.0000 [IU] | ORAL_CAPSULE | ORAL | 0 refills | Status: DC
Start: 2016-06-06 — End: 2016-09-13

## 2016-06-06 NOTE — Telephone Encounter (Signed)
Left message for patient to call back regarding labs. 

## 2016-06-06 NOTE — Telephone Encounter (Signed)
-----   Message from Steele Sizer, MD sent at 06/06/2016  8:10 AM EDT ----- Vitamin D is low, I will send prescription vitamin D to  pharmacy and once finished she/he needs to take otc vitamin D 2000 units daily Normal B12, but towards low end of normal , she can take otc Vitamin B12 1000 mcg sub-lingual Fasting glucose is at goal, potassium is slightly low ( increase dietary potassium intake - spinach, pears), normal liver enzymes and kidney function  Lipid panel shows low HDL : to improve HDL patient  needs to eat tree nuts ( pecans/pistachios/almonds ) four times weekly, eat fish two times weekly  and exercise  at least 150 minutes per week. Continue medications Normal TSH and CBC

## 2016-06-12 ENCOUNTER — Ambulatory Visit (INDEPENDENT_AMBULATORY_CARE_PROVIDER_SITE_OTHER): Payer: Commercial Managed Care - PPO | Admitting: Family Medicine

## 2016-06-12 ENCOUNTER — Encounter: Payer: Self-pay | Admitting: Family Medicine

## 2016-06-12 VITALS — BP 116/78 | HR 99 | Temp 98.0°F | Ht 64.0 in | Wt 170.4 lb

## 2016-06-12 DIAGNOSIS — Z1231 Encounter for screening mammogram for malignant neoplasm of breast: Secondary | ICD-10-CM | POA: Diagnosis not present

## 2016-06-12 DIAGNOSIS — Z01419 Encounter for gynecological examination (general) (routine) without abnormal findings: Secondary | ICD-10-CM | POA: Diagnosis not present

## 2016-06-12 DIAGNOSIS — Z1239 Encounter for other screening for malignant neoplasm of breast: Secondary | ICD-10-CM

## 2016-06-12 MED ORDER — B-12 1000 MCG SL SUBL: 1.0000 | SUBLINGUAL_TABLET | Freq: Every day | SUBLINGUAL | 5 refills | Status: AC
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
## 2016-06-12 NOTE — Patient Instructions (Signed)
Vitamin D 2000 units daily after you finish Vitamin D prescription Vitamin B12 sub-lingual 1000 mcg daily

## 2016-06-15 LAB — NASAL CULTURE (N/P): ORGANISM ID, BACTERIA: NORMAL

## 2016-07-31 ENCOUNTER — Ambulatory Visit
Admission: RE | Admit: 2016-07-31 | Discharge: 2016-07-31 | Disposition: A | Discharge status: Home / Self Care | Payer: Commercial Managed Care - PPO | Source: Ambulatory Visit | Attending: Family Medicine | Admitting: Family Medicine

## 2016-07-31 DIAGNOSIS — Z1231 Encounter for screening mammogram for malignant neoplasm of breast: Secondary | ICD-10-CM | POA: Insufficient documentation

## 2016-07-31 DIAGNOSIS — Z1239 Encounter for other screening for malignant neoplasm of breast: Secondary | ICD-10-CM

## 2016-08-07 ENCOUNTER — Other Ambulatory Visit: Payer: Self-pay | Admitting: Family Medicine

## 2016-08-07 DIAGNOSIS — E113213 Type 2 diabetes mellitus with mild nonproliferative diabetic retinopathy with macular edema, bilateral: Secondary | ICD-10-CM

## 2016-08-07 DIAGNOSIS — I10 Essential (primary) hypertension: Secondary | ICD-10-CM

## 2016-08-07 DIAGNOSIS — Z794 Long term (current) use of insulin: Secondary | ICD-10-CM

## 2016-08-07 NOTE — Telephone Encounter (Signed)
Patient requesting refill of Hyzaar and Metformin to [facility].

## 2016-08-17 ENCOUNTER — Telehealth: Payer: Self-pay | Admitting: Family Medicine

## 2016-08-17 NOTE — Telephone Encounter (Signed)
[facility] can request the transfer from the previous pharmacy

## 2016-08-17 NOTE — Telephone Encounter (Signed)
Pt.notified

## 2016-08-17 NOTE — Telephone Encounter (Signed)
Patient is asking that you please send xultophy to [facility]. She would like to know if it is less expensive there.

## 2016-09-10 ENCOUNTER — Other Ambulatory Visit: Payer: Self-pay | Admitting: Family Medicine

## 2016-09-10 ENCOUNTER — Telehealth: Payer: Self-pay | Admitting: Family Medicine

## 2016-09-10 ENCOUNTER — Ambulatory Visit: Payer: Commercial Managed Care - PPO | Admitting: Family Medicine

## 2016-09-10 DIAGNOSIS — Z794 Long term (current) use of insulin: Principal | ICD-10-CM

## 2016-09-10 DIAGNOSIS — E113213 Type 2 diabetes mellitus with mild nonproliferative diabetic retinopathy with macular edema, bilateral: Secondary | ICD-10-CM

## 2016-09-10 DIAGNOSIS — I10 Essential (primary) hypertension: Secondary | ICD-10-CM

## 2016-09-10 MED ORDER — LOSARTAN POTASSIUM-HCTZ 100-25 MG PO TABS: 1.0000 | ORAL_TABLET | Freq: Every day | ORAL | 1 refills | Status: DC

## 2016-09-10 MED ORDER — METFORMIN HCL ER 500 MG PO TB24: ORAL_TABLET | ORAL | 0 refills | Status: DC

## 2016-09-10 NOTE — Telephone Encounter (Signed)
Pt is completely out of metformin and losartin. She has switched pharmacy and is asking that you send to [facility]

## 2016-09-13 ENCOUNTER — Ambulatory Visit (INDEPENDENT_AMBULATORY_CARE_PROVIDER_SITE_OTHER): Payer: Commercial Managed Care - PPO | Admitting: Family Medicine

## 2016-09-13 ENCOUNTER — Encounter: Payer: Self-pay | Admitting: Family Medicine

## 2016-09-13 VITALS — BP 116/66 | HR 97 | Temp 98.0°F | Resp 16 | Ht 64.0 in | Wt 175.3 lb

## 2016-09-13 DIAGNOSIS — E119 Type 2 diabetes mellitus without complications: Secondary | ICD-10-CM

## 2016-09-13 DIAGNOSIS — E1159 Type 2 diabetes mellitus with other circulatory complications: Secondary | ICD-10-CM

## 2016-09-13 DIAGNOSIS — E559 Vitamin D deficiency, unspecified: Secondary | ICD-10-CM

## 2016-09-13 DIAGNOSIS — Z794 Long term (current) use of insulin: Secondary | ICD-10-CM

## 2016-09-13 DIAGNOSIS — E669 Obesity, unspecified: Secondary | ICD-10-CM | POA: Diagnosis not present

## 2016-09-13 DIAGNOSIS — E1169 Type 2 diabetes mellitus with other specified complication: Secondary | ICD-10-CM

## 2016-09-13 DIAGNOSIS — E113213 Type 2 diabetes mellitus with mild nonproliferative diabetic retinopathy with macular edema, bilateral: Secondary | ICD-10-CM | POA: Diagnosis not present

## 2016-09-13 DIAGNOSIS — I10 Essential (primary) hypertension: Secondary | ICD-10-CM

## 2016-09-13 DIAGNOSIS — B3731 Acute candidiasis of vulva and vagina: Secondary | ICD-10-CM

## 2016-09-13 DIAGNOSIS — B373 Candidiasis of vulva and vagina: Secondary | ICD-10-CM | POA: Diagnosis not present

## 2016-09-13 DIAGNOSIS — J3089 Other allergic rhinitis: Secondary | ICD-10-CM | POA: Diagnosis not present

## 2016-09-13 DIAGNOSIS — F320 Major depressive disorder, single episode, mild: Secondary | ICD-10-CM | POA: Diagnosis not present

## 2016-09-13 DIAGNOSIS — E785 Hyperlipidemia, unspecified: Secondary | ICD-10-CM

## 2016-09-13 LAB — POCT UA - MICROALBUMIN: Microalbumin Ur, POC: 20 mg/L

## 2016-09-13 LAB — POCT GLYCOSYLATED HEMOGLOBIN (HGB A1C): HEMOGLOBIN A1C: 8

## 2016-09-13 MED ORDER — FLUTICASONE PROPIONATE 50 MCG/ACT NA SUSP: 2.0000 | Freq: Every day | NASAL | 1 refills | Status: DC

## 2016-09-13 MED ORDER — METFORMIN HCL ER 750 MG PO TB24: ORAL_TABLET | ORAL | 1 refills | Status: DC

## 2016-09-13 MED ORDER — INSULIN DEGLUDEC-LIRAGLUTIDE 100-3.6 UNIT-MG/ML ~~LOC~~ SOPN: 50.0000 [IU] | PEN_INJECTOR | Freq: Every day | SUBCUTANEOUS | 2 refills | Status: DC

## 2016-09-13 MED ORDER — DAPAGLIFLOZIN PRO-METFORMIN ER 10-1000 MG PO TB24: 1.0000 | ORAL_TABLET | Freq: Every day | ORAL | 2 refills | Status: DC

## 2016-09-13 MED ORDER — ATORVASTATIN CALCIUM 40 MG PO TABS: 40.0000 mg | ORAL_TABLET | Freq: Every day | ORAL | 1 refills | Status: DC

## 2016-09-13 MED ORDER — LOSARTAN POTASSIUM-HCTZ 100-25 MG PO TABS: 1.0000 | ORAL_TABLET | Freq: Every day | ORAL | 1 refills | Status: DC

## 2016-09-13 MED ORDER — VITAMIN D (ERGOCALCIFEROL) 1.25 MG (50000 UNIT) PO CAPS: 50000.0000 [IU] | ORAL_CAPSULE | ORAL | 0 refills | Status: DC

## 2016-09-13 MED ORDER — FLUCONAZOLE 150 MG PO TABS: 150.0000 mg | ORAL_TABLET | Freq: Every day | ORAL | 1 refills | Status: DC
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
## 2016-09-14 ENCOUNTER — Telehealth: Payer: Self-pay | Admitting: Family Medicine

## 2016-09-14 ENCOUNTER — Other Ambulatory Visit: Payer: Self-pay | Admitting: Family Medicine

## 2016-09-14 DIAGNOSIS — E113213 Type 2 diabetes mellitus with mild nonproliferative diabetic retinopathy with macular edema, bilateral: Secondary | ICD-10-CM

## 2016-09-14 DIAGNOSIS — Z794 Long term (current) use of insulin: Principal | ICD-10-CM

## 2016-09-14 MED ORDER — INSULIN DEGLUDEC-LIRAGLUTIDE 100-3.6 UNIT-MG/ML ~~LOC~~ SOPN: 50.0000 [IU] | PEN_INJECTOR | Freq: Every day | SUBCUTANEOUS | 2 refills | Status: DC

## 2016-09-14 NOTE — Telephone Encounter (Signed)
Pt was told to increase her units to 50 units with xultophy. States that 5 pens comes in the box and that mean she will have to get it refilled every 30 days. Would like to know if the bottle is less expensive.

## 2016-11-13 ENCOUNTER — Encounter (INDEPENDENT_AMBULATORY_CARE_PROVIDER_SITE_OTHER): Payer: Commercial Managed Care - PPO | Admitting: Ophthalmology

## 2016-11-13 DIAGNOSIS — H2513 Age-related nuclear cataract, bilateral: Secondary | ICD-10-CM | POA: Diagnosis not present

## 2016-11-13 DIAGNOSIS — E11311 Type 2 diabetes mellitus with unspecified diabetic retinopathy with macular edema: Secondary | ICD-10-CM

## 2016-11-13 DIAGNOSIS — H35033 Hypertensive retinopathy, bilateral: Secondary | ICD-10-CM | POA: Diagnosis not present

## 2016-11-13 DIAGNOSIS — I10 Essential (primary) hypertension: Secondary | ICD-10-CM | POA: Diagnosis not present

## 2016-11-13 DIAGNOSIS — H43813 Vitreous degeneration, bilateral: Secondary | ICD-10-CM

## 2016-11-13 DIAGNOSIS — E113513 Type 2 diabetes mellitus with proliferative diabetic retinopathy with macular edema, bilateral: Secondary | ICD-10-CM

## 2016-11-13 LAB — HM DIABETES EYE EXAM

## 2016-12-05 ENCOUNTER — Other Ambulatory Visit: Payer: Self-pay | Admitting: Family Medicine

## 2016-12-05 DIAGNOSIS — Z794 Long term (current) use of insulin: Principal | ICD-10-CM

## 2016-12-05 DIAGNOSIS — E113213 Type 2 diabetes mellitus with mild nonproliferative diabetic retinopathy with macular edema, bilateral: Secondary | ICD-10-CM

## 2016-12-06 NOTE — Telephone Encounter (Signed)
Patient requesting refill of Metformin to [facility].

## 2016-12-11 ENCOUNTER — Ambulatory Visit: Payer: Commercial Managed Care - PPO | Admitting: Family Medicine

## 2016-12-14 ENCOUNTER — Ambulatory Visit (INDEPENDENT_AMBULATORY_CARE_PROVIDER_SITE_OTHER): Payer: Commercial Managed Care - PPO | Admitting: Family Medicine

## 2016-12-14 ENCOUNTER — Encounter: Payer: Self-pay | Admitting: Family Medicine

## 2016-12-14 VITALS — BP 118/76 | HR 93 | Temp 97.8°F | Resp 16 | Ht 64.0 in | Wt 176.9 lb

## 2016-12-14 DIAGNOSIS — I152 Hypertension secondary to endocrine disorders: Secondary | ICD-10-CM

## 2016-12-14 DIAGNOSIS — E669 Obesity, unspecified: Secondary | ICD-10-CM

## 2016-12-14 DIAGNOSIS — E1159 Type 2 diabetes mellitus with other circulatory complications: Secondary | ICD-10-CM

## 2016-12-14 DIAGNOSIS — Z794 Long term (current) use of insulin: Secondary | ICD-10-CM | POA: Diagnosis not present

## 2016-12-14 DIAGNOSIS — F320 Major depressive disorder, single episode, mild: Secondary | ICD-10-CM

## 2016-12-14 DIAGNOSIS — M1712 Unilateral primary osteoarthritis, left knee: Secondary | ICD-10-CM

## 2016-12-14 DIAGNOSIS — E785 Hyperlipidemia, unspecified: Secondary | ICD-10-CM

## 2016-12-14 DIAGNOSIS — E1169 Type 2 diabetes mellitus with other specified complication: Secondary | ICD-10-CM

## 2016-12-14 DIAGNOSIS — I10 Essential (primary) hypertension: Secondary | ICD-10-CM

## 2016-12-14 DIAGNOSIS — E119 Type 2 diabetes mellitus without complications: Secondary | ICD-10-CM | POA: Diagnosis not present

## 2016-12-14 DIAGNOSIS — E113213 Type 2 diabetes mellitus with mild nonproliferative diabetic retinopathy with macular edema, bilateral: Secondary | ICD-10-CM

## 2016-12-14 LAB — POCT GLYCOSYLATED HEMOGLOBIN (HGB A1C): Hemoglobin A1C: 6.3

## 2016-12-14 MED ORDER — METFORMIN HCL ER 750 MG PO TB24: ORAL_TABLET | ORAL | 0 refills | Status: DC

## 2016-12-14 MED ORDER — ATORVASTATIN CALCIUM 40 MG PO TABS: 40.0000 mg | ORAL_TABLET | Freq: Every day | ORAL | 1 refills | Status: DC

## 2016-12-14 MED ORDER — INSULIN DEGLUDEC-LIRAGLUTIDE 100-3.6 UNIT-MG/ML ~~LOC~~ SOPN: 50.0000 [IU] | PEN_INJECTOR | Freq: Every day | SUBCUTANEOUS | 2 refills | Status: DC

## 2016-12-14 MED ORDER — LOSARTAN POTASSIUM-HCTZ 100-25 MG PO TABS: 1.0000 | ORAL_TABLET | Freq: Every day | ORAL | 1 refills | Status: DC
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
## 2017-03-14 ENCOUNTER — Ambulatory Visit (INDEPENDENT_AMBULATORY_CARE_PROVIDER_SITE_OTHER): Payer: Commercial Managed Care - PPO | Admitting: Family Medicine

## 2017-03-14 ENCOUNTER — Encounter: Payer: Self-pay | Admitting: Family Medicine

## 2017-03-14 VITALS — BP 128/70 | HR 90 | Temp 97.6°F | Resp 16 | Wt 175.0 lb

## 2017-03-14 DIAGNOSIS — E785 Hyperlipidemia, unspecified: Secondary | ICD-10-CM

## 2017-03-14 DIAGNOSIS — E119 Type 2 diabetes mellitus without complications: Secondary | ICD-10-CM

## 2017-03-14 DIAGNOSIS — E1159 Type 2 diabetes mellitus with other circulatory complications: Secondary | ICD-10-CM

## 2017-03-14 DIAGNOSIS — I10 Essential (primary) hypertension: Secondary | ICD-10-CM | POA: Diagnosis not present

## 2017-03-14 DIAGNOSIS — E113213 Type 2 diabetes mellitus with mild nonproliferative diabetic retinopathy with macular edema, bilateral: Secondary | ICD-10-CM

## 2017-03-14 DIAGNOSIS — E1169 Type 2 diabetes mellitus with other specified complication: Secondary | ICD-10-CM | POA: Diagnosis not present

## 2017-03-14 DIAGNOSIS — Z794 Long term (current) use of insulin: Secondary | ICD-10-CM | POA: Diagnosis not present

## 2017-03-14 DIAGNOSIS — E669 Obesity, unspecified: Secondary | ICD-10-CM | POA: Diagnosis not present

## 2017-03-14 DIAGNOSIS — M1712 Unilateral primary osteoarthritis, left knee: Secondary | ICD-10-CM | POA: Diagnosis not present

## 2017-03-14 DIAGNOSIS — F325 Major depressive disorder, single episode, in full remission: Secondary | ICD-10-CM | POA: Diagnosis not present

## 2017-03-14 LAB — POCT GLYCOSYLATED HEMOGLOBIN (HGB A1C): HEMOGLOBIN A1C: 5.8

## 2017-03-14 MED ORDER — INSULIN DEGLUDEC-LIRAGLUTIDE 100-3.6 UNIT-MG/ML ~~LOC~~ SOPN: 50.0000 [IU] | PEN_INJECTOR | Freq: Every day | SUBCUTANEOUS | 2 refills | Status: DC
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
## 2017-03-14 NOTE — Patient Instructions (Signed)
Please go down on Xultophy from 50 units to 48 units and check glucose every morning to make sure stays between 100-120, if still below that go down by 2 units every 3 days to get it to goal.

## 2017-03-15 ENCOUNTER — Ambulatory Visit: Payer: Commercial Managed Care - PPO | Admitting: Family Medicine

## 2017-04-02 ENCOUNTER — Telehealth: Payer: Self-pay | Admitting: Family Medicine

## 2017-04-02 NOTE — Telephone Encounter (Signed)
I spoke with Dr Ancil Boozer and she okayed for patient to get 2 boxes of xultophy. Pt informed and will pick it up tomorrow.

## 2017-04-02 NOTE — Telephone Encounter (Signed)
Pt is needing samples of xultophy for she did not have the money to get her meds. Please call and advise.

## 2017-04-03 ENCOUNTER — Encounter: Payer: Self-pay | Admitting: Family Medicine

## 2017-04-04 ENCOUNTER — Encounter: Payer: Self-pay | Admitting: Family Medicine

## 2017-04-25 ENCOUNTER — Encounter: Payer: Self-pay | Admitting: Family Medicine

## 2017-04-25 ENCOUNTER — Ambulatory Visit (INDEPENDENT_AMBULATORY_CARE_PROVIDER_SITE_OTHER): Payer: Commercial Managed Care - PPO | Admitting: Family Medicine

## 2017-04-25 VITALS — BP 122/72 | HR 92 | Temp 98.5°F | Resp 16 | Ht 64.0 in | Wt 169.9 lb

## 2017-04-25 DIAGNOSIS — Z862 Personal history of diseases of the blood and blood-forming organs and certain disorders involving the immune mechanism: Secondary | ICD-10-CM

## 2017-04-25 DIAGNOSIS — E1169 Type 2 diabetes mellitus with other specified complication: Secondary | ICD-10-CM | POA: Diagnosis not present

## 2017-04-25 DIAGNOSIS — Z1211 Encounter for screening for malignant neoplasm of colon: Secondary | ICD-10-CM | POA: Diagnosis not present

## 2017-04-25 DIAGNOSIS — Z0000 Encounter for general adult medical examination without abnormal findings: Secondary | ICD-10-CM

## 2017-04-25 DIAGNOSIS — E113213 Type 2 diabetes mellitus with mild nonproliferative diabetic retinopathy with macular edema, bilateral: Secondary | ICD-10-CM | POA: Diagnosis not present

## 2017-04-25 DIAGNOSIS — E538 Deficiency of other specified B group vitamins: Secondary | ICD-10-CM

## 2017-04-25 DIAGNOSIS — E559 Vitamin D deficiency, unspecified: Secondary | ICD-10-CM | POA: Diagnosis not present

## 2017-04-25 DIAGNOSIS — Z1231 Encounter for screening mammogram for malignant neoplasm of breast: Secondary | ICD-10-CM | POA: Diagnosis not present

## 2017-04-25 DIAGNOSIS — E785 Hyperlipidemia, unspecified: Secondary | ICD-10-CM

## 2017-04-25 DIAGNOSIS — Z794 Long term (current) use of insulin: Secondary | ICD-10-CM | POA: Diagnosis not present

## 2017-04-25 DIAGNOSIS — Z1239 Encounter for other screening for malignant neoplasm of breast: Secondary | ICD-10-CM

## 2017-04-25 DIAGNOSIS — Z531 Procedure and treatment not carried out because of patient's decision for reasons of belief and group pressure: Secondary | ICD-10-CM | POA: Diagnosis not present

## 2017-04-25 DIAGNOSIS — E663 Overweight: Secondary | ICD-10-CM | POA: Diagnosis not present

## 2017-04-25 DIAGNOSIS — I10 Essential (primary) hypertension: Secondary | ICD-10-CM

## 2017-04-25 DIAGNOSIS — IMO0001 Reserved for inherently not codable concepts without codable children: Secondary | ICD-10-CM

## 2017-04-25 DIAGNOSIS — Z1212 Encounter for screening for malignant neoplasm of rectum: Secondary | ICD-10-CM

## 2017-04-25 NOTE — Patient Instructions (Addendum)
Mammogram Due July 31, 2017 - Call [facility]. Colonoscopy Due August 24, 2016 - We will call you to schedule your appointment. Preventive Care 40-64 Years, Female Preventive care refers to lifestyle choices and visits with your health care provider that can promote health and wellness. What does preventive care include?  A yearly physical exam. This is also called an annual well check.  Dental exams once or twice a year.  Routine eye exams. Ask your health care provider how often you should have your eyes checked.  Personal lifestyle choices, including: ? Daily care of your teeth and gums. ? Regular physical activity. ? Eating a healthy diet. ? Avoiding tobacco and drug use. ? Limiting alcohol use. ? Practicing safe sex. ? Taking low-dose aspirin daily starting at age 24. ? Taking vitamin and mineral supplements as recommended by your health care provider. What happens during an annual well check? The services and screenings done by your health care provider during your annual well check will depend on your age, overall health, lifestyle risk factors, and family history of disease. Counseling Your health care provider may ask you questions about your:  Alcohol use.  Tobacco use.  Drug use.  Emotional well-being.  Home and relationship well-being.  Sexual activity.  Eating habits.  Work and work Statistician.  Method of birth control.  Menstrual cycle.  Pregnancy history.  Screening You may have the following tests or measurements:  Height, weight, and BMI.  Blood pressure.  Lipid and cholesterol levels. These may be checked every 5 years, or more frequently if you are over 21 years old.  Skin check.  Lung cancer screening. You may have this screening every year starting at age 60 if you have a 30-pack-year history of smoking and currently smoke or have quit within the past 15 years.  Fecal occult blood test (FOBT) of the stool. You may  have this test every year starting at age 41.  Flexible sigmoidoscopy or colonoscopy. You may have a sigmoidoscopy every 5 years or a colonoscopy every 10 years starting at age 13.  Hepatitis C blood test.  Hepatitis B blood test.  Sexually transmitted disease (STD) testing.  Diabetes screening. This is done by checking your blood sugar (glucose) after you have not eaten for a while (fasting). You may have this done every 1-3 years.  Mammogram. This may be done every 1-2 years. Talk to your health care provider about when you should start having regular mammograms. This may depend on whether you have a family history of breast cancer.  BRCA-related cancer screening. This may be done if you have a family history of breast, ovarian, tubal, or peritoneal cancers.  Pelvic exam and Pap test. This may be done every 3 years starting at age 25. Starting at age 79, this may be done every 5 years if you have a Pap test in combination with an HPV test.  Bone density scan. This is done to screen for osteoporosis. You may have this scan if you are at high risk for osteoporosis.  Discuss your test results, treatment options, and if necessary, the need for more tests with your health care provider. Vaccines Your health care provider may recommend certain vaccines, such as:  Influenza vaccine. This is recommended every year.  Tetanus, diphtheria, and acellular pertussis (Tdap, Td) vaccine. You may need a Td booster every 10 years.  Varicella vaccine. You may need this if you have not been vaccinated.  Zoster vaccine. You may need this  after age 3.  Measles, mumps, and rubella (MMR) vaccine. You may need at least one dose of MMR if you were born in 1957 or later. You may also need a second dose.  Pneumococcal 13-valent conjugate (PCV13) vaccine. You may need this if you have certain conditions and were not previously vaccinated.  Pneumococcal polysaccharide (PPSV23) vaccine. You may need one or  two doses if you smoke cigarettes or if you have certain conditions.  Meningococcal vaccine. You may need this if you have certain conditions.  Hepatitis A vaccine. You may need this if you have certain conditions or if you travel or work in places where you may be exposed to hepatitis A.  Hepatitis B vaccine. You may need this if you have certain conditions or if you travel or work in places where you may be exposed to hepatitis B.  Haemophilus influenzae type b (Hib) vaccine. You may need this if you have certain conditions.  Talk to your health care provider about which screenings and vaccines you need and how often you need them. This information is not intended to replace advice given to you by your health care provider. Make sure you discuss any questions you have with your health care provider. Document Released: 08/19/2015 Document Revised: 04/11/2016 Document Reviewed: 05/24/2015 Elsevier Interactive Patient Education  2017 [employer].
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
## 2017-04-26 ENCOUNTER — Other Ambulatory Visit: Payer: Self-pay | Admitting: Family Medicine

## 2017-04-26 DIAGNOSIS — Z862 Personal history of diseases of the blood and blood-forming organs and certain disorders involving the immune mechanism: Secondary | ICD-10-CM

## 2017-04-26 LAB — COMPLETE METABOLIC PANEL WITH GFR
AG RATIO: 1.8 (calc) (ref 1.0–2.5)
ALT: 19 U/L (ref 6–29)
AST: 19 U/L (ref 10–35)
Albumin: 4.4 g/dL (ref 3.6–5.1)
Alkaline phosphatase (APISO): 78 U/L (ref 33–130)
BUN/Creatinine Ratio: 24 (calc) — ABNORMAL HIGH (ref 6–22)
BUN: 25 mg/dL (ref 7–25)
CALCIUM: 10 mg/dL (ref 8.6–10.4)
CO2: 29 mmol/L (ref 20–32)
Chloride: 106 mmol/L (ref 98–110)
Creat: 1.04 mg/dL — ABNORMAL HIGH (ref 0.50–0.99)
GFR, EST NON AFRICAN AMERICAN: 58 mL/min/{1.73_m2} — AB (ref >=60)
GFR, Est African American: 67 mL/min/{1.73_m2} (ref >=60)
GLOBULIN: 2.5 g/dL (ref 1.9–3.7)
Glucose, Bld: 64 mg/dL — ABNORMAL LOW (ref 65–99)
POTASSIUM: 3.6 mmol/L (ref 3.5–5.3)
Sodium: 143 mmol/L (ref 135–146)
TOTAL PROTEIN: 6.9 g/dL (ref 6.1–8.1)
Total Bilirubin: 0.4 mg/dL (ref 0.2–1.2)

## 2017-04-26 LAB — TEST AUTHORIZATION

## 2017-04-26 LAB — LIPID PANEL
Cholesterol: 135 mg/dL (ref <200)
HDL: 50 mg/dL — ABNORMAL LOW (ref >50)
LDL Cholesterol (Calc): 67 mg/dL (calc)
NON-HDL CHOLESTEROL (CALC): 85 mg/dL (ref <130)
Total CHOL/HDL Ratio: 2.7 (calc) (ref <5.0)
Triglycerides: 93 mg/dL (ref <150)

## 2017-04-26 LAB — CBC
HEMATOCRIT: 33.8 % — AB (ref 35.0–45.0)
HEMOGLOBIN: 11.5 g/dL — AB (ref 11.7–15.5)
MCH: 30 pg (ref 27.0–33.0)
MCHC: 34 g/dL (ref 32.0–36.0)
MCV: 88.3 fL (ref 80.0–100.0)
MPV: 9.8 fL (ref 7.5–12.5)
Platelets: 313 10*3/uL (ref 140–400)
RBC: 3.83 10*6/uL (ref 3.80–5.10)
RDW: 13.2 % (ref 11.0–15.0)
WBC: 5 10*3/uL (ref 3.8–10.8)

## 2017-04-26 LAB — VITAMIN B12

## 2017-04-26 LAB — IRON,TIBC AND FERRITIN PANEL
%SAT: 25 % (ref 11–50)
Ferritin: 113 ng/mL (ref 20–288)
IRON: 85 ug/dL (ref 45–160)
TIBC: 334 ug/dL (ref 250–450)

## 2017-04-26 LAB — VITAMIN D 25 HYDROXY (VIT D DEFICIENCY, FRACTURES): Vit D, 25-Hydroxy: 52 ng/mL (ref 30–100)

## 2017-04-26 NOTE — Addendum Note (Signed)
Addended by: Inda Coke on: 04/26/2017 03:03 PM   Modules accepted: Orders

## 2017-05-15 ENCOUNTER — Telehealth: Payer: Self-pay | Admitting: Family Medicine

## 2017-05-15 NOTE — Telephone Encounter (Signed)
We can only give her one box per month. Samples are for new starts

## 2017-05-15 NOTE — Telephone Encounter (Signed)
Wanted to ask since patient has been receiving samples frequently. Did not want this to interfere with her compliance rating with her insurance. Since she has been receiving so many samples from our office previously. Please advise.

## 2017-05-15 NOTE — Telephone Encounter (Signed)
Pt called asking if she can get 2 Xultoothy samples.

## 2017-05-15 NOTE — Telephone Encounter (Signed)
Patient notified and verbalized understanding how medication can not be given out so freely. We are only given a certain quantity but she can pick up 1 box.

## 2017-06-14 ENCOUNTER — Encounter: Payer: Commercial Managed Care - PPO | Admitting: Family Medicine

## 2017-06-19 ENCOUNTER — Encounter: Payer: Commercial Managed Care - PPO | Admitting: Family Medicine

## 2017-07-05 ENCOUNTER — Encounter (INDEPENDENT_AMBULATORY_CARE_PROVIDER_SITE_OTHER): Payer: Commercial Managed Care - PPO | Admitting: Ophthalmology

## 2017-07-08 ENCOUNTER — Telehealth: Payer: Self-pay | Admitting: Family Medicine

## 2017-07-08 NOTE — Telephone Encounter (Signed)
Copied from [place]. Topic: Quick Communication - See Telephone Encounter >> Jul 08, 2017  3:32 PM Synthia Innocent wrote: CRM for notification. See Telephone encounter for: Insurance will not cover insulin. Please advise. PA needed?  07/08/17.

## 2017-07-09 NOTE — Telephone Encounter (Signed)
Copied from [place] [phone]. Topic: Quick Communication - See Telephone Encounter >> Jul 09, 2017  4:44 PM Vernona Rieger wrote: CRM for notification. See Telephone encounter for:  See resolved CRM. Patient said she is out of her insulin. I advised her of the 3 business days.  07/09/17.

## 2017-07-09 NOTE — Telephone Encounter (Signed)
Please give her a sample of Xultophy and find out about coverage and what we need to do about it? Do I need to split medication

## 2017-07-09 NOTE — Telephone Encounter (Signed)
Have you received one for the Xultophy?

## 2017-07-09 NOTE — Telephone Encounter (Signed)
Insulin  questian   PA  ?

## 2017-07-10 NOTE — Telephone Encounter (Signed)
Know I have not seen PA for Xultophy.

## 2017-07-10 NOTE — Telephone Encounter (Signed)
Pt will come by when she gets off after 2 today. She states the only thing we need to about her medication is to send in the authorization form to her pharmacy.

## 2017-07-10 NOTE — Telephone Encounter (Signed)
I have not seen the PA for Xultophy. Did you?

## 2017-07-11 NOTE — Telephone Encounter (Signed)
Theressa Stamps (Key: [id number]) - [id number] Xultophy 100/3.6 Units-mg/mL Status: PA Request Created: December 3rd, 2018 [id number] Sent: December 3rd, 2018

## 2017-07-12 ENCOUNTER — Ambulatory Visit (INDEPENDENT_AMBULATORY_CARE_PROVIDER_SITE_OTHER): Payer: Commercial Managed Care - PPO | Admitting: Family Medicine

## 2017-07-12 ENCOUNTER — Encounter: Payer: Self-pay | Admitting: Family Medicine

## 2017-07-12 VITALS — BP 106/64 | HR 97 | Temp 97.9°F | Ht 64.0 in | Wt 176.1 lb

## 2017-07-12 DIAGNOSIS — E113213 Type 2 diabetes mellitus with mild nonproliferative diabetic retinopathy with macular edema, bilateral: Secondary | ICD-10-CM

## 2017-07-12 DIAGNOSIS — E1169 Type 2 diabetes mellitus with other specified complication: Secondary | ICD-10-CM

## 2017-07-12 DIAGNOSIS — H6122 Impacted cerumen, left ear: Secondary | ICD-10-CM | POA: Diagnosis not present

## 2017-07-12 DIAGNOSIS — Z794 Long term (current) use of insulin: Secondary | ICD-10-CM | POA: Diagnosis not present

## 2017-07-12 DIAGNOSIS — E663 Overweight: Secondary | ICD-10-CM

## 2017-07-12 DIAGNOSIS — E785 Hyperlipidemia, unspecified: Secondary | ICD-10-CM

## 2017-07-12 DIAGNOSIS — I10 Essential (primary) hypertension: Secondary | ICD-10-CM | POA: Diagnosis not present

## 2017-07-12 DIAGNOSIS — M1712 Unilateral primary osteoarthritis, left knee: Secondary | ICD-10-CM

## 2017-07-12 DIAGNOSIS — F320 Major depressive disorder, single episode, mild: Secondary | ICD-10-CM

## 2017-07-12 LAB — POCT GLYCOSYLATED HEMOGLOBIN (HGB A1C): HEMOGLOBIN A1C: 6.5

## 2017-07-12 MED ORDER — LOSARTAN POTASSIUM-HCTZ 100-12.5 MG PO TABS: 1.0000 | ORAL_TABLET | Freq: Every day | ORAL | 0 refills | Status: DC

## 2017-07-12 MED ORDER — ATORVASTATIN CALCIUM 40 MG PO TABS: 40.0000 mg | ORAL_TABLET | Freq: Every day | ORAL | 1 refills | Status: DC
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
## 2017-07-12 NOTE — Patient Instructions (Addendum)
[employer] - Good eyeglasses website  Goal Blood Sugar is between 100-120, continue to titrate your Xultophy down by 2 units as needed if blood sugar is <100.  Xultophy Prior Authorization is complete and approved, we faxed this to your pharmacy and gave you a copy.

## 2017-07-15 ENCOUNTER — Ambulatory Visit: Payer: Commercial Managed Care - PPO | Admitting: Family Medicine

## 2017-07-19 ENCOUNTER — Other Ambulatory Visit: Payer: Self-pay

## 2017-07-19 DIAGNOSIS — Z1211 Encounter for screening for malignant neoplasm of colon: Secondary | ICD-10-CM

## 2017-07-19 NOTE — Progress Notes (Signed)
Gastroenterology Pre-Procedure Review  Request Date: 1/10 Requesting Physician: Dr. Vicente Males  PATIENT REVIEW QUESTIONS: The patient responded to the following health history questions as indicated:    1. Are you having any GI issues? no 2. Do you have a personal history of Polyps? no 3. Do you have a family history of Colon Cancer or Polyps? no 4. Diabetes Mellitus? yes (Type II) 5. Joint replacements in the past 12 months?no 6. Major health problems in the past 3 months?no 7. Any artificial heart valves, MVP, or defibrillator?no    MEDICATIONS & ALLERGIES:    Patient reports the following regarding taking any anticoagulation/antiplatelet therapy:   Plavix, Coumadin, Eliquis, Xarelto, Lovenox, Pradaxa, Brilinta, or Effient? no Aspirin? yes (81mg )  Patient confirms/reports the following medications:  Current Outpatient Medications  Medication Sig Dispense Refill  . metFORMIN (GLUCOPHAGE-XR) 500 MG 24 hr tablet Take 500-1,000 mg by mouth.    Marland Kitchen acetaminophen (TYLENOL) 500 MG tablet Take 1 tablet (500 mg total) by mouth every 8 (eight) hours as needed. 90 tablet 0  . aspirin 81 MG tablet 1 tablet daily.    Marland Kitchen atorvastatin (LIPITOR) 40 MG tablet Take 1 tablet (40 mg total) by mouth daily. 90 tablet 1  . Cholecalciferol (VITAMIN D) 2000 units CAPS Take 1 capsule by mouth daily.    . Cyanocobalamin (B-12) 1000 MCG SUBL Place 1 tablet under the tongue daily. 30 each 5  . fluticasone (FLONASE) 50 MCG/ACT nasal spray Place 2 sprays into both nostrils daily. (Patient not taking: Reported on 07/12/2017) 48 g 1  . Insulin Degludec-Liraglutide (XULTOPHY) 100-3.6 UNIT-MG/ML SOPN Inject 50 Units into the skin daily. 18 mL 2  . loratadine (CLARITIN) 10 MG tablet Take 1 tablet (10 mg total) by mouth daily. 90 tablet 1  . losartan-hydrochlorothiazide (HYZAAR) 100-12.5 MG tablet Take 1 tablet by mouth daily. 90 tablet 0  . RELION INSULIN SYR 0.5CC/30G 30G X 5/16" 0.5 ML MISC     . triamcinolone cream  (KENALOG) 0.1 % Apply 1 application topically 2 (two) times daily. 80 g 0   No current facility-administered medications for this visit.     Patient confirms/reports the following allergies:  No Known Allergies  No orders of the defined types were placed in this encounter.   AUTHORIZATION INFORMATION Primary Insurance: 1D#: Group #:  Secondary Insurance: 1D#: Group #:  SCHEDULE INFORMATION: Date: 1/10 Time: Location: [place]

## 2017-08-01 ENCOUNTER — Ambulatory Visit
Admission: RE | Admit: 2017-08-01 | Discharge: 2017-08-01 | Disposition: A | Discharge status: Home / Self Care | Payer: Commercial Managed Care - PPO | Source: Ambulatory Visit | Attending: Family Medicine | Admitting: Family Medicine

## 2017-08-01 DIAGNOSIS — Z1231 Encounter for screening mammogram for malignant neoplasm of breast: Secondary | ICD-10-CM | POA: Insufficient documentation

## 2017-08-01 DIAGNOSIS — Z1239 Encounter for other screening for malignant neoplasm of breast: Secondary | ICD-10-CM

## 2017-08-15 ENCOUNTER — Encounter: Payer: Self-pay | Admitting: Anesthesiology

## 2017-08-15 ENCOUNTER — Ambulatory Visit
Admission: RE | Admit: 2017-08-15 | Discharge: 2017-08-15 | Disposition: A | Discharge status: Home / Self Care | Payer: Commercial Managed Care - PPO | Source: Ambulatory Visit | Attending: Gastroenterology | Admitting: Gastroenterology

## 2017-08-15 ENCOUNTER — Ambulatory Visit: Payer: Commercial Managed Care - PPO | Admitting: Anesthesiology

## 2017-08-15 ENCOUNTER — Encounter
Admission: RE | Disposition: A | Discharge status: Home / Self Care | Payer: Self-pay | Source: Ambulatory Visit | Attending: Gastroenterology

## 2017-08-15 DIAGNOSIS — E119 Type 2 diabetes mellitus without complications: Secondary | ICD-10-CM | POA: Diagnosis not present

## 2017-08-15 DIAGNOSIS — K635 Polyp of colon: Secondary | ICD-10-CM | POA: Insufficient documentation

## 2017-08-15 DIAGNOSIS — D122 Benign neoplasm of ascending colon: Secondary | ICD-10-CM | POA: Insufficient documentation

## 2017-08-15 DIAGNOSIS — Z7982 Long term (current) use of aspirin: Secondary | ICD-10-CM | POA: Diagnosis not present

## 2017-08-15 DIAGNOSIS — E785 Hyperlipidemia, unspecified: Secondary | ICD-10-CM | POA: Diagnosis not present

## 2017-08-15 DIAGNOSIS — D125 Benign neoplasm of sigmoid colon: Secondary | ICD-10-CM

## 2017-08-15 DIAGNOSIS — I10 Essential (primary) hypertension: Secondary | ICD-10-CM | POA: Diagnosis not present

## 2017-08-15 DIAGNOSIS — Z79899 Other long term (current) drug therapy: Secondary | ICD-10-CM | POA: Diagnosis not present

## 2017-08-15 DIAGNOSIS — Z1211 Encounter for screening for malignant neoplasm of colon: Secondary | ICD-10-CM | POA: Diagnosis not present

## 2017-08-15 DIAGNOSIS — Z794 Long term (current) use of insulin: Secondary | ICD-10-CM | POA: Insufficient documentation

## 2017-08-15 HISTORY — PX: COLONOSCOPY WITH PROPOFOL: SHX5780

## 2017-08-15 LAB — GLUCOSE, CAPILLARY
GLUCOSE-CAPILLARY: 50 mg/dL — AB (ref 65–99)
Glucose-Capillary: 53 mg/dL — ABNORMAL LOW (ref 65–99)
Glucose-Capillary: 80 mg/dL (ref 65–99)

## 2017-08-15 SURGERY — COLONOSCOPY WITH PROPOFOL
Anesthesia: General

## 2017-08-15 MED ORDER — PROPOFOL 500 MG/50ML IV EMUL
INTRAVENOUS | Status: DC | PRN
  Administered 2017-08-15: 120 ug/kg/min via INTRAVENOUS

## 2017-08-15 MED ORDER — FENTANYL CITRATE (PF) 100 MCG/2ML IJ SOLN
INTRAMUSCULAR | Status: AC
  Filled 2017-08-15: qty 2

## 2017-08-15 MED ORDER — MIDAZOLAM HCL 2 MG/2ML IJ SOLN
INTRAMUSCULAR | Status: AC
  Filled 2017-08-15: qty 2

## 2017-08-15 MED ORDER — EPHEDRINE SULFATE 50 MG/ML IJ SOLN
INTRAMUSCULAR | Status: DC | PRN
  Administered 2017-08-15 (×2): 10 mg via INTRAVENOUS

## 2017-08-15 MED ORDER — EPHEDRINE SULFATE 50 MG/ML IJ SOLN
INTRAMUSCULAR | Status: AC
  Filled 2017-08-15: qty 1

## 2017-08-15 MED ORDER — FENTANYL CITRATE (PF) 100 MCG/2ML IJ SOLN
INTRAMUSCULAR | Status: DC | PRN
  Administered 2017-08-15: 50 ug via INTRAVENOUS

## 2017-08-15 MED ORDER — MIDAZOLAM HCL 2 MG/2ML IJ SOLN
INTRAMUSCULAR | Status: DC | PRN
  Administered 2017-08-15: 2 mg via INTRAVENOUS

## 2017-08-15 MED ORDER — PROPOFOL 500 MG/50ML IV EMUL
INTRAVENOUS | Status: AC
  Filled 2017-08-15: qty 50

## 2017-08-15 MED ORDER — DEXTROSE 50 % IV SOLN
INTRAVENOUS | Status: AC
  Administered 2017-08-15: 12.5 mL
  Filled 2017-08-15: qty 50

## 2017-08-15 MED ORDER — SODIUM CHLORIDE 0.9 % IV SOLN
INTRAVENOUS | Status: DC
  Administered 2017-08-15: 1000 mL via INTRAVENOUS
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
## 2017-08-15 NOTE — Anesthesia Procedure Notes (Signed)
Performed by: Cook-Martin, Hue Steveson Pre-anesthesia Checklist: Patient identified, Emergency Drugs available, Suction available, Patient being monitored and Timeout performed Patient Re-evaluated:Patient Re-evaluated prior to induction Oxygen Delivery Method: Nasal cannula Preoxygenation: Pre-oxygenation with 100% oxygen Induction Type: IV induction Placement Confirmation: positive ETCO2 and CO2 detector       

## 2017-08-15 NOTE — Transfer of Care (Signed)
Immediate Anesthesia Transfer of Care Note  Patient: Patricia Chan  Procedure(s) Performed: COLONOSCOPY WITH PROPOFOL (N/A )  Patient Location: PACU  Anesthesia Type:General  Level of Consciousness: awake and sedated  Airway & Oxygen Therapy: Patient Spontanous Breathing and Patient connected to nasal cannula oxygen  Post-op Assessment: Report given to RN and Post -op Vital signs reviewed and stable  Post vital signs: Reviewed and stable  Last Vitals:  Vitals:   08/15/17 0816  BP: (!) 138/109  Pulse: 100  Temp: (!) 36.1 C  SpO2: 99%    Last Pain:  Vitals:   08/15/17 0816  TempSrc: Tympanic         Complications: No apparent anesthesia complications

## 2017-08-15 NOTE — Anesthesia Post-op Follow-up Note (Signed)
Anesthesia QCDR form completed.        

## 2017-08-15 NOTE — Op Note (Signed)
[facility] Gastroenterology Patient Name: Patricia Chan Procedure Date: 08/15/2017 8:41 AM MRN: [id number] Account #: [id number] Date of Birth: 08-29-1955 Admit Type: Outpatient Age: 62 Room: [facility] ENDO ROOM 3 Gender: Female Note Status: Finalized Procedure:            Colonoscopy Indications:          Screening for colorectal malignant neoplasm Providers:            Jonathon Bellows MD, MD Referring MD:         Bethena Roys. Sowles, MD (Referring MD) Medicines:            Monitored Anesthesia Care Complications:        No immediate complications. Procedure:            Pre-Anesthesia Assessment:                       - Prior to the procedure, a History and Physical was                        performed, and patient medications, allergies and                        sensitivities were reviewed. The patient's tolerance of                        previous anesthesia was reviewed.                       - The risks and benefits of the procedure and the                        sedation options and risks were discussed with the                        patient. All questions were answered and informed                        consent was obtained.                       - ASA Grade Assessment: III - A patient with severe                        systemic disease.                       After obtaining informed consent, the colonoscope was                        passed under direct vision. Throughout the procedure,                        the patient's blood pressure, pulse, and oxygen                        saturations were monitored continuously. The                        Colonoscope was introduced through the anus and  advanced to the the cecum, identified by the                        appendiceal orifice, IC valve and transillumination.                        The colonoscopy was performed with ease. The patient                        tolerated the procedure  well. Findings:      The perianal and digital rectal examinations were normal.      Two sessile polyps were found in the ascending colon. The polyps were 3       to 4 mm in size. These polyps were removed with a cold biopsy forceps.       Resection and retrieval were complete.      A 5 mm polyp was found in the sigmoid colon. The polyp was sessile. The       polyp was removed with a cold snare. Resection and retrieval were       complete.      The exam was otherwise without abnormality on direct and retroflexion       views. Impression:           - Two 3 to 4 mm polyps in the ascending colon, removed                        with a cold biopsy forceps. Resected and retrieved.                       - One 5 mm polyp in the sigmoid colon, removed with a                        cold snare. Resected and retrieved.                       - The examination was otherwise normal on direct and                        retroflexion views. Recommendation:       - Discharge patient to home (with escort).                       - Resume previous diet.                       - Continue present medications.                       - Await pathology results.                       - Repeat colonoscopy in 3 - 5 years for surveillance                        based on pathology results. Procedure Code(s):    --- Professional ---                       [phone], Colonoscopy, flexible; with removal of tumor(s),  polyp(s), or other lesion(s) by snare technique                       [id number], 59, Colonoscopy, flexible; with biopsy, single                        or multiple Diagnosis Code(s):    --- Professional ---                       Z12.11, Encounter for screening for malignant neoplasm                        of colon                       D12.2, Benign neoplasm of ascending colon                       D12.5, Benign neoplasm of sigmoid colon CPT copyright 2016 American Medical Association. All rights  reserved. The codes documented in this report are preliminary and upon coder review may  be revised to meet current compliance requirements. Jonathon Bellows, MD Jonathon Bellows MD, MD 08/15/2017 9:10:34 AM This report has been signed electronically. Number of Addenda: 0 Note Initiated On: 08/15/2017 8:41 AM Scope Withdrawal Time: 0 hours 12 minutes 54 seconds  Total Procedure Duration: 0 hours 20 minutes 52 seconds       [facility]
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
## 2017-08-16 LAB — SURGICAL PATHOLOGY

## 2017-08-18 NOTE — Anesthesia Postprocedure Evaluation (Signed)
Anesthesia Post Note  Patient: Sevanna Ballengee  Procedure(s) Performed: COLONOSCOPY WITH PROPOFOL (N/A )  Patient location during evaluation: PACU Anesthesia Type: General Level of consciousness: awake and alert Pain management: pain level controlled Vital Signs Assessment: post-procedure vital signs reviewed and stable Respiratory status: spontaneous breathing, nonlabored ventilation, respiratory function stable and patient connected to nasal cannula oxygen Cardiovascular status: blood pressure returned to baseline and stable Postop Assessment: no apparent nausea or vomiting Anesthetic complications: no     Last Vitals:  Vitals:   08/15/17 0940 08/15/17 0950  BP: (!) 144/77   Pulse: 93 96  Resp: 14 18  Temp:    SpO2: 100% 100%    Last Pain:  Vitals:   08/16/17 0738  TempSrc:   PainSc: 0-No pain                 Molli Barrows

## 2017-08-19 ENCOUNTER — Encounter: Payer: Self-pay | Admitting: Gastroenterology

## 2017-08-27 ENCOUNTER — Telehealth: Payer: Self-pay

## 2017-08-27 NOTE — Telephone Encounter (Signed)
Advised patient of results per Dr. Vicente Males.    - inform 2 adenomas seen in pathology -repeat colonoscopy in 5 years, there was mild inflammation of the colon seen, if has diarrhea to come and see me otherwise follow up with Steele Sizer, MD

## 2017-09-05 ENCOUNTER — Encounter: Payer: Self-pay | Admitting: Family Medicine

## 2017-09-05 ENCOUNTER — Ambulatory Visit (INDEPENDENT_AMBULATORY_CARE_PROVIDER_SITE_OTHER): Payer: Commercial Managed Care - PPO | Admitting: Family Medicine

## 2017-09-05 VITALS — BP 124/68 | HR 98 | Temp 98.2°F | Resp 16 | Ht 64.0 in | Wt 175.4 lb

## 2017-09-05 DIAGNOSIS — M1712 Unilateral primary osteoarthritis, left knee: Secondary | ICD-10-CM

## 2017-09-05 MED ORDER — MELOXICAM 7.5 MG PO TABS: 7.5000 mg | ORAL_TABLET | Freq: Every day | ORAL | 0 refills | Status: DC

## 2017-09-05 NOTE — Patient Instructions (Addendum)
Osteoarthritis Osteoarthritis is a type of arthritis that affects tissue that covers the ends of bones in joints (cartilage). Cartilage acts as a cushion between the bones and helps them move smoothly. Osteoarthritis results when cartilage in the joints gets worn down. Osteoarthritis is sometimes called "wear and tear" arthritis. Osteoarthritis is the most common form of arthritis. It often occurs in older people. It is a condition that gets worse over time (a progressive condition). Joints that are most often affected by this condition are in:  Fingers.  Toes.  Hips.  Knees.  Spine, including neck and lower back.  What are the causes? This condition is caused by age-related wearing down of cartilage that covers the ends of bones. What increases the risk? The following factors may make you more likely to develop this condition:  Older age.  Being overweight or obese.  Overuse of joints, such as in athletes.  Past injury of a joint.  Past surgery on a joint.  Family history of osteoarthritis.  What are the signs or symptoms? The main symptoms of this condition are pain, swelling, and stiffness in the joint. The joint may lose its shape over time. Small pieces of bone or cartilage may break off and float inside of the joint, which may cause more pain and damage to the joint. Small deposits of bone (osteophytes) may grow on the edges of the joint. Other symptoms may include:  A grating or scraping feeling inside the joint when you move it.  Popping or creaking sounds when you move.  Symptoms may affect one or more joints. Osteoarthritis in a major joint, such as your knee or hip, can make it painful to walk or exercise. If you have osteoarthritis in your hands, you might not be able to grip items, twist your hand, or control small movements of your hands and fingers (fine motor skills). How is this diagnosed? This condition may be diagnosed based on:  Your medical history.  A  physical exam.  Your symptoms.  X-rays of the affected joint(s).  Blood tests to rule out other types of arthritis.  How is this treated? There is no cure for this condition, but treatment can help to control pain and improve joint function. Treatment plans may include:  A prescribed exercise program that allows for rest and joint relief. You may work with a physical therapist.  A weight control plan.  Pain relief techniques, such as: ? Applying heat and cold to the joint. ? Electric pulses delivered to nerve endings under the skin (transcutaneous electrical nerve stimulation, or TENS). ? Massage. ? Certain nutritional supplements.  NSAIDs or prescription medicines to help relieve pain.  Medicine to help relieve pain and inflammation (corticosteroids). This can be given by mouth (orally) or as an injection.  Assistive devices, such as a brace, wrap, splint, specialized glove, or cane.  Surgery, such as: ? An osteotomy. This is done to reposition the bones and relieve pain or to remove loose pieces of bone and cartilage. ? Joint replacement surgery. You may need this surgery if you have very bad (advanced) osteoarthritis.  Follow these instructions at home: Activity  Rest your affected joints as directed by your health care provider.  Do not drive or use heavy machinery while taking prescription pain medicine.  Exercise as directed. Your health care provider or physical therapist may recommend specific types of exercise, such as: ? Strengthening exercises. These are done to strengthen the muscles that support joints that are affected by arthritis.   They can be performed with weights or with exercise bands to add resistance. ? Aerobic activities. These are exercises, such as brisk walking or water aerobics, that get your heart pumping. ? Range-of-motion activities. These keep your joints easy to move. ? Balance and agility exercises. Managing pain, stiffness, and  swelling  If directed, apply heat to the affected area as often as told by your health care provider. Use the heat source that your health care provider recommends, such as a moist heat pack or a heating pad. ? If you have a removable assistive device, remove it as told by your health care provider. ? Place a towel between your skin and the heat source. If your health care provider tells you to keep the assistive device on while you apply heat, place a towel between the assistive device and the heat source. ? Leave the heat on for 20-30 minutes. ? Remove the heat if your skin turns bright red. This is especially important if you are unable to feel pain, heat, or cold. You may have a greater risk of getting burned.  If directed, put ice on the affected joint: ? If you have a removable assistive device, remove it as told by your health care provider. ? Put ice in a plastic bag. ? Place a towel between your skin and the bag. If your health care provider tells you to keep the assistive device on during icing, place a towel between the assistive device and the bag. ? Leave the ice on for 20 minutes, 2-3 times a day. General instructions  Take over-the-counter and prescription medicines only as told by your health care provider.  Maintain a healthy weight. Follow instructions from your health care provider for weight control. These may include dietary restrictions.  Do not use any products that contain nicotine or tobacco, such as cigarettes and e-cigarettes. These can delay bone healing. If you need help quitting, ask your health care provider.  Use assistive devices as directed by your health care provider.  Keep all follow-up visits as told by your health care provider. This is important. Where to find more information:  [employer] of Arthritis and Musculoskeletal and Skin Diseases: www.niams.SouthExposed.es  [employer] on Aging: http://kim-miller.com/  American College of Rheumatology:  www.rheumatology.org Contact a health care provider if:  Your skin turns red.  You develop a rash.  You have pain that gets worse.  You have a fever along with joint or muscle aches. Get help right away if:  You lose a lot of weight.  You suddenly lose your appetite.  You have night sweats. Summary  Osteoarthritis is a type of arthritis that affects tissue covering the ends of bones in joints (cartilage).  This condition is caused by age-related wearing down of cartilage that covers the ends of bones.  The main symptom of this condition is pain, swelling, and stiffness in the joint.  There is no cure for this condition, but treatment can help to control pain and improve joint function. This information is not intended to replace advice given to you by your health care provider. Make sure you discuss any questions you have with your health care provider. Document Released: 07/23/2005 Document Revised: 03/26/2016 Document Reviewed: 03/26/2016 Elsevier Interactive Patient Education  2018 [place] for Routine Care of Injuries Many injuries can be cared for using rest, ice, compression, and elevation (RICE therapy). Using RICE therapy can help to lessen pain and swelling. It can help your body to heal.  Rest Reduce your normal activities and avoid using the injured part of your body. You can go back to your normal activities when you feel okay and your doctor says it is okay. Ice Do not put ice on your bare skin.  Put ice in a plastic bag.  Place a towel between your skin and the bag.  Leave the ice on for 20 minutes, 2-3 times a day.  Do this for as long as told by your doctor. Compression Compression means putting pressure on the injured area. This can be done with an elastic bandage. If an elastic bandage has been applied:  Remove and reapply the bandage every 3-4 hours or as told by your doctor.  Make sure the bandage is not wrapped too tight. Wrap the  bandage more loosely if part of your body beyond the bandage is blue, swollen, cold, painful, or loses feeling (numb).  See your doctor if the bandage seems to make your problems worse.  Elevation Elevation means keeping the injured area raised. Raise the injured area above your heart or the center of your chest if you can. When should I get help? You should get help if:  You keep having pain and swelling.  Your symptoms get worse.  Get help right away if: You should get help right away if:  You have sudden bad pain at or below the area of your injury.  You have redness or more swelling around your injury.  You have tingling or numbness at or below the injury that does not go away when you take off the bandage.  This information is not intended to replace advice given to you by your health care provider. Make sure you discuss any questions you have with your health care provider. Document Released: 01/09/2008 Document Revised: 06/19/2016 Document Reviewed: 06/30/2014 Elsevier Interactive Patient Education  2017 [employer].

## 2017-09-05 NOTE — Progress Notes (Signed)
Name: Patricia Chan   MRN: [id number]    DOB: [birth year]   Date:09/05/2017       Progress Note  Subjective  Chief Complaint  Chief Complaint  Patient presents with  . Knee Pain    left knee pain    HPI  PT presents with ongoing LEFT knee pain. Pt has LEFT knee arthritis - has been taking Tylenol for pain without relief.  She notes some mild swelling to the medial left knee, no tenderness.  She wants to get an injection - last steroid injection was by Dr. Ancil Boozer 07/08/2015, and this worked well for her.  Occasional tingling to the LEFT knee on occasion.  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  No weakness, no redness or tenderness.  Patient Active Problem List   Diagnosis Date Noted  . Refusal of blood transfusions as patient is Jehovah's Witness 04/25/2017  . Obesity, diabetes, and hypertension syndrome ([place]) 06/05/2016  . Snoring 03/05/2016  . Osteoarthritis of left knee 07/28/2015  . Chronic knee pain 04/14/2015  . Benign essential HTN 01/11/2015  . Depression, major, single episode, mild ([place]) 01/11/2015  . Diabetes mellitus type 2 with retinopathy ([place]) 01/11/2015  . Dyslipidemia associated with type 2 diabetes mellitus ([place]) 01/11/2015  . History of anemia 01/11/2015  . Arthritis of shoulder region, degenerative 01/11/2015  . 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 Overweight (BMI 25.0-29.9) 01/11/2015  . Perennial allergic rhinitis 01/11/2015    Social History   Tobacco Use  . Smoking status: Never Smoker  . Smokeless tobacco: Never Used  Substance Use Topics  . Alcohol use: Yes    Alcohol/week: 0.0 oz    Comment: rarely     Current Outpatient Medications:  .  acetaminophen (TYLENOL) 500 MG tablet, Take 1 tablet (500 mg total) by mouth every 8 (eight) hours as needed., Disp: 90 tablet, Rfl: 0 .  aspirin 81 MG tablet, 1 tablet daily., Disp: , Rfl:  .  atorvastatin (LIPITOR) 40 MG tablet, Take 1 tablet (40 mg total) by mouth daily., Disp: 90  tablet, Rfl: 1 .  Cholecalciferol (VITAMIN D) 2000 units CAPS, Take 1 capsule by mouth daily., Disp: , Rfl:  .  Cyanocobalamin (B-12) 1000 MCG SUBL, Place 1 tablet under the tongue daily., Disp: 30 each, Rfl: 5 .  fluticasone (FLONASE) 50 MCG/ACT nasal spray, Place 2 sprays into both nostrils daily., Disp: 48 g, Rfl: 1 .  Insulin Degludec-Liraglutide (XULTOPHY) 100-3.6 UNIT-MG/ML SOPN, Inject 50 Units into the skin daily., Disp: 18 mL, Rfl: 2 .  loratadine (CLARITIN) 10 MG tablet, Take 1 tablet (10 mg total) by mouth daily., Disp: 90 tablet, Rfl: 1 .  losartan-hydrochlorothiazide (HYZAAR) 100-12.5 MG tablet, Take 1 tablet by mouth daily., Disp: 90 tablet, Rfl: 0 .  metFORMIN (GLUCOPHAGE-XR) 500 MG 24 hr tablet, Take 500-1,000 mg by mouth., Disp: , Rfl:  .  RELION INSULIN SYR 0.5CC/30G 30G X 5/16" 0.5 ML MISC, , Disp: , Rfl:  .  triamcinolone cream (KENALOG) 0.1 %, Apply 1 application topically 2 (two) times daily., Disp: 80 g, Rfl: 0  No Known Allergies  ROS Constitutional: Negative for fever or weight change.  Respiratory: Negative for cough and shortness of breath.   Cardiovascular: Negative for chest pain or palpitations.  Gastrointestinal: Negative for abdominal pain, no bowel changes.  Musculoskeletal: See HPI. Skin: Negative for rash.  Neurological: Negative for dizziness or headache.  No other specific complaints in a complete review of systems (except as  listed in HPI above).  Objective  Vitals:   09/05/17 1456  BP: 124/68  Pulse: 98  Resp: 16  Temp: 98.2 F (36.8 C)  TempSrc: Oral  SpO2: 98%  Weight: 175 lb 6.4 oz (79.6 kg)  Height: 5\' 4"  (1.626 m)   Body mass index is 30.11 kg/m.  Nursing Note and Vital Signs reviewed.  Physical Exam  Constitutional: Patient appears well-developed and well-nourished. Obese. No distress.  HEENT: head atraumatic, normocephalic Cardiovascular: Normal rate, regular rhythm, S1/S2 present.  No murmur or rub heard. No BLE  edema. Pulmonary/Chest: Effort normal and breath sounds clear. No respiratory distress or retractions. Psychiatric: Patient has a normal mood and affect. behavior is normal. Judgment and thought content normal. Musculoskeletal: Normal range of motion, no joint effusions. No gross deformities.  Mild crepitus to the LEFT knee, minor edema to medial left knee. Neurological: he is alert and oriented to person, place, and time. No cranial nerve deficit. Coordination, balance, strength, speech and gait are normal.  Skin: Skin is warm and dry. No rash noted. No erythema.  Psychiatric: Patient has a normal mood and affect. behavior is normal. Judgment and thought content normal.  No results found for this or any previous visit (from the past 72 hour(s)).  Assessment & Plan  1. Primary osteoarthritis of left knee - meloxicam (MOBIC) 7.5 MG tablet; Take 1 tablet (7.5 mg total) by mouth daily.  Dispense: 15 tablet; Refill: 0 - We will schedule for injection with PCP Dr. Ancil Boozer; advised to use Meloxicam in the meantime.  Advised RICE precautions

## 2017-09-17 ENCOUNTER — Ambulatory Visit: Payer: Commercial Managed Care - PPO | Admitting: Family Medicine

## 2017-10-11 ENCOUNTER — Other Ambulatory Visit: Payer: Self-pay | Admitting: Family Medicine

## 2017-10-11 DIAGNOSIS — E113213 Type 2 diabetes mellitus with mild nonproliferative diabetic retinopathy with macular edema, bilateral: Secondary | ICD-10-CM

## 2017-10-11 DIAGNOSIS — Z794 Long term (current) use of insulin: Principal | ICD-10-CM

## 2017-10-11 MED ORDER — INSULIN DEGLUDEC-LIRAGLUTIDE 100-3.6 UNIT-MG/ML ~~LOC~~ SOPN: 50.0000 [IU] | PEN_INJECTOR | Freq: Every day | SUBCUTANEOUS | 0 refills | Status: DC

## 2017-10-11 NOTE — Telephone Encounter (Signed)
Copied from [place] [phone]. Topic: General - Other >> Oct 11, 2017  8:11 AM Darl Householder, RMA wrote: Reason for CRM: Medication refill request for Insulin Degludec-Liraglutide (XULTOPHY) 100-3.6 UNIT-MG/ML SOPN to be sent to [facility] [address]

## 2017-10-11 NOTE — Telephone Encounter (Signed)
Refill request for diabetic medication:   Xultophy 100-3.6 units  Last office visit pertaining to diabetes: 07/12/2018   Lab Results  Component Value Date   HGBA1C 6.5 07/12/2017    Follow-up on file. 10/14/2017

## 2017-10-14 ENCOUNTER — Ambulatory Visit (INDEPENDENT_AMBULATORY_CARE_PROVIDER_SITE_OTHER): Payer: Commercial Managed Care - PPO | Admitting: Family Medicine

## 2017-10-14 ENCOUNTER — Encounter: Payer: Self-pay | Admitting: Family Medicine

## 2017-10-14 VITALS — BP 122/62 | HR 94 | Resp 16 | Ht 64.0 in | Wt 176.4 lb

## 2017-10-14 DIAGNOSIS — Z794 Long term (current) use of insulin: Secondary | ICD-10-CM | POA: Diagnosis not present

## 2017-10-14 DIAGNOSIS — E538 Deficiency of other specified B group vitamins: Secondary | ICD-10-CM

## 2017-10-14 DIAGNOSIS — E119 Type 2 diabetes mellitus without complications: Secondary | ICD-10-CM

## 2017-10-14 DIAGNOSIS — E1169 Type 2 diabetes mellitus with other specified complication: Secondary | ICD-10-CM | POA: Diagnosis not present

## 2017-10-14 DIAGNOSIS — E113213 Type 2 diabetes mellitus with mild nonproliferative diabetic retinopathy with macular edema, bilateral: Secondary | ICD-10-CM

## 2017-10-14 DIAGNOSIS — E669 Obesity, unspecified: Secondary | ICD-10-CM

## 2017-10-14 DIAGNOSIS — E785 Hyperlipidemia, unspecified: Secondary | ICD-10-CM

## 2017-10-14 DIAGNOSIS — I10 Essential (primary) hypertension: Secondary | ICD-10-CM

## 2017-10-14 DIAGNOSIS — I152 Hypertension secondary to endocrine disorders: Secondary | ICD-10-CM

## 2017-10-14 DIAGNOSIS — Z23 Encounter for immunization: Secondary | ICD-10-CM | POA: Diagnosis not present

## 2017-10-14 DIAGNOSIS — F325 Major depressive disorder, single episode, in full remission: Secondary | ICD-10-CM

## 2017-10-14 DIAGNOSIS — E1159 Type 2 diabetes mellitus with other circulatory complications: Secondary | ICD-10-CM

## 2017-10-14 DIAGNOSIS — J3089 Other allergic rhinitis: Secondary | ICD-10-CM

## 2017-10-14 LAB — POCT GLYCOSYLATED HEMOGLOBIN (HGB A1C): Hemoglobin A1C: 6.6

## 2017-10-14 MED ORDER — INSULIN PEN NEEDLE 30G X 8 MM MISC: 1.0000 | 1 refills | Status: DC | PRN

## 2017-10-14 MED ORDER — SILVER SULFADIAZINE 1 % EX CREA: 1.0000 "application " | TOPICAL_CREAM | Freq: Every day | CUTANEOUS | 0 refills | Status: DC

## 2017-10-14 MED ORDER — LOSARTAN POTASSIUM-HCTZ 100-12.5 MG PO TABS: 1.0000 | ORAL_TABLET | Freq: Every day | ORAL | 1 refills | Status: DC

## 2017-10-14 MED ORDER — INSULIN DEGLUDEC-LIRAGLUTIDE 100-3.6 UNIT-MG/ML ~~LOC~~ SOPN: 50.0000 [IU] | PEN_INJECTOR | Freq: Every day | SUBCUTANEOUS | 0 refills | Status: DC

## 2017-10-14 MED ORDER — LORATADINE 10 MG PO TABS: 10.0000 mg | ORAL_TABLET | Freq: Every day | ORAL | 1 refills | Status: DC

## 2017-10-14 MED ORDER — FLUTICASONE PROPIONATE 50 MCG/ACT NA SUSP: 2.0000 | Freq: Every day | NASAL | 1 refills | Status: DC

## 2017-10-14 NOTE — Patient Instructions (Signed)
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
## 2017-11-11 ENCOUNTER — Telehealth: Payer: Self-pay

## 2017-11-11 DIAGNOSIS — E113213 Type 2 diabetes mellitus with mild nonproliferative diabetic retinopathy with macular edema, bilateral: Secondary | ICD-10-CM

## 2017-11-11 DIAGNOSIS — Z794 Long term (current) use of insulin: Principal | ICD-10-CM

## 2017-11-11 MED ORDER — INSULIN DEGLUDEC-LIRAGLUTIDE 100-3.6 UNIT-MG/ML ~~LOC~~ SOPN: 50.0000 [IU] | PEN_INJECTOR | Freq: Every day | SUBCUTANEOUS | 3 refills | Status: DC

## 2017-11-11 NOTE — Telephone Encounter (Signed)
Copied from [place] [id number]. Topic: Quick Communication - See Telephone Encounter >> Nov 11, 2017  2:22 PM Antonieta Iba C wrote: CRM for notification. See Telephone encounter for: 11/11/17.  Pt called in to request a sample of Insulin Degludec-Liraglutide (XULTOPHY) 100-3.6 UNIT-MG/ML SOPN , please assist further.    Last office visit pertaining to diabetes: 10/14/2017   Lab Results  Component Value Date   HGBA1C 6.6 10/14/2017    Follow-ups on file. 02/14/2018

## 2017-11-13 NOTE — Telephone Encounter (Signed)
Patient calling checking on status of sample request. Patient is hoping to pick up tomorrow if a sample is at the office. Please advise.

## 2017-11-14 NOTE — Telephone Encounter (Signed)
Patient will come today and pick up sample.

## 2017-11-28 LAB — HM DIABETES EYE EXAM

## 2017-11-29 ENCOUNTER — Encounter: Payer: Self-pay | Admitting: Family Medicine

## 2018-02-11 ENCOUNTER — Encounter: Payer: Self-pay | Admitting: Family Medicine

## 2018-02-11 ENCOUNTER — Ambulatory Visit (INDEPENDENT_AMBULATORY_CARE_PROVIDER_SITE_OTHER): Payer: Commercial Managed Care - PPO | Admitting: Family Medicine

## 2018-02-11 VITALS — BP 118/62 | HR 86 | Temp 98.3°F | Resp 16 | Ht 64.0 in | Wt 183.5 lb

## 2018-02-11 DIAGNOSIS — M7752 Other enthesopathy of left foot: Secondary | ICD-10-CM

## 2018-02-11 DIAGNOSIS — E113213 Type 2 diabetes mellitus with mild nonproliferative diabetic retinopathy with macular edema, bilateral: Secondary | ICD-10-CM

## 2018-02-11 DIAGNOSIS — F325 Major depressive disorder, single episode, in full remission: Secondary | ICD-10-CM

## 2018-02-11 DIAGNOSIS — I10 Essential (primary) hypertension: Secondary | ICD-10-CM | POA: Diagnosis not present

## 2018-02-11 DIAGNOSIS — E1169 Type 2 diabetes mellitus with other specified complication: Secondary | ICD-10-CM | POA: Diagnosis not present

## 2018-02-11 DIAGNOSIS — E785 Hyperlipidemia, unspecified: Secondary | ICD-10-CM

## 2018-02-11 DIAGNOSIS — M779 Enthesopathy, unspecified: Secondary | ICD-10-CM

## 2018-02-11 DIAGNOSIS — Z794 Long term (current) use of insulin: Secondary | ICD-10-CM

## 2018-02-11 DIAGNOSIS — M1712 Unilateral primary osteoarthritis, left knee: Secondary | ICD-10-CM

## 2018-02-11 DIAGNOSIS — D638 Anemia in other chronic diseases classified elsewhere: Secondary | ICD-10-CM

## 2018-02-11 MED ORDER — MELOXICAM 15 MG PO TABS: 15.0000 mg | ORAL_TABLET | Freq: Every day | ORAL | 0 refills | Status: DC
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
## 2018-02-12 LAB — COMPLETE METABOLIC PANEL WITH GFR
AG RATIO: 1.9 (calc) (ref 1.0–2.5)
ALBUMIN MSPROF: 4.5 g/dL (ref 3.6–5.1)
ALT: 21 U/L (ref 6–29)
AST: 19 U/L (ref 10–35)
Alkaline phosphatase (APISO): 71 U/L (ref 33–130)
BUN: 21 mg/dL (ref 7–25)
CALCIUM: 10 mg/dL (ref 8.6–10.4)
CO2: 30 mmol/L (ref 20–32)
Chloride: 106 mmol/L (ref 98–110)
Creat: 0.92 mg/dL (ref 0.50–0.99)
GFR, EST NON AFRICAN AMERICAN: 67 mL/min/{1.73_m2} (ref >=60)
GFR, Est African American: 77 mL/min/{1.73_m2} (ref >=60)
GLOBULIN: 2.4 g/dL (ref 1.9–3.7)
Glucose, Bld: 81 mg/dL (ref 65–139)
POTASSIUM: 3.6 mmol/L (ref 3.5–5.3)
SODIUM: 142 mmol/L (ref 135–146)
Total Bilirubin: 0.3 mg/dL (ref 0.2–1.2)
Total Protein: 6.9 g/dL (ref 6.1–8.1)

## 2018-02-12 LAB — HEMOGLOBIN A1C
EAG (MMOL/L): 7.4 (calc)
HEMOGLOBIN A1C: 6.3 %{Hb} — AB (ref <5.7)
Mean Plasma Glucose: 134 (calc)

## 2018-02-12 LAB — CBC WITH DIFFERENTIAL/PLATELET
Basophils Absolute: 59 cells/uL (ref 0–200)
Basophils Relative: 1 %
EOS PCT: 3.9 %
Eosinophils Absolute: 230 cells/uL (ref 15–500)
HCT: 33.9 % — ABNORMAL LOW (ref 35.0–45.0)
Hemoglobin: 11.7 g/dL (ref 11.7–15.5)
Lymphs Abs: 1823 cells/uL (ref 850–3900)
MCH: 30.6 pg (ref 27.0–33.0)
MCHC: 34.5 g/dL (ref 32.0–36.0)
MCV: 88.7 fL (ref 80.0–100.0)
MONOS PCT: 9.2 %
MPV: 9.6 fL (ref 7.5–12.5)
NEUTROS PCT: 55 %
Neutro Abs: 3245 cells/uL (ref 1500–7800)
PLATELETS: 334 10*3/uL (ref 140–400)
RBC: 3.82 10*6/uL (ref 3.80–5.10)
RDW: 13.2 % (ref 11.0–15.0)
TOTAL LYMPHOCYTE: 30.9 %
WBC mixed population: 543 cells/uL (ref 200–950)
WBC: 5.9 10*3/uL (ref 3.8–10.8)

## 2018-02-12 LAB — LIPID PANEL
CHOL/HDL RATIO: 2.6 (calc) (ref <5.0)
CHOLESTEROL: 129 mg/dL (ref <200)
HDL: 49 mg/dL — AB (ref >50)
LDL CHOLESTEROL (CALC): 64 mg/dL
Non-HDL Cholesterol (Calc): 80 mg/dL (calc) (ref <130)
Triglycerides: 78 mg/dL (ref <150)

## 2018-02-14 ENCOUNTER — Ambulatory Visit: Payer: Commercial Managed Care - PPO | Admitting: Family Medicine

## 2018-02-14 ENCOUNTER — Other Ambulatory Visit: Payer: Self-pay

## 2018-02-14 DIAGNOSIS — E113213 Type 2 diabetes mellitus with mild nonproliferative diabetic retinopathy with macular edema, bilateral: Secondary | ICD-10-CM

## 2018-02-14 DIAGNOSIS — Z794 Long term (current) use of insulin: Principal | ICD-10-CM

## 2018-02-14 NOTE — Telephone Encounter (Signed)
-----   Message from Steele Sizer, MD sent at 02/13/2018  2:26 PM EDT ----- HgbA1C has improved, down to 6.3% Lipid panel is at goal , continue medication  Normal sugar, kidney and liver function test Normal CBC

## 2018-02-14 NOTE — Telephone Encounter (Signed)
Patient states the pharmacy told her the Xultophy had expired and needs a new prescription. Also patient asked if all her labs are doing well and her A1C why is she gaining weight?

## 2018-02-16 MED ORDER — INSULIN DEGLUDEC-LIRAGLUTIDE 100-3.6 UNIT-MG/ML ~~LOC~~ SOPN: 50.0000 [IU] | PEN_INJECTOR | Freq: Every day | SUBCUTANEOUS | 0 refills | Status: DC

## 2018-02-17 ENCOUNTER — Telehealth: Payer: Self-pay | Admitting: Family Medicine

## 2018-02-17 NOTE — Telephone Encounter (Signed)
yes

## 2018-02-17 NOTE — Telephone Encounter (Signed)
Copied from [place]. Topic: Quick Communication - Rx Refill/Question >> Feb 17, 2018  3:39 PM Oliver Pila B wrote: Medication: Insulin Degludec-Liraglutide (XULTOPHY) 100-3.6 UNIT-MG/ML SOPN [[id number]]   Pt called b/c she is needing some samples of the medication above or an alternate medication b/c the medication above is still not available at her pharmacy; she was told by pharmacist to check back tomorrow, contact pt to advise

## 2018-02-17 NOTE — Telephone Encounter (Signed)
Is it ok to give her another sample?

## 2018-02-18 NOTE — Telephone Encounter (Signed)
Please call patient and ask her to come by at her convenience to pick up a sample.  Thanks

## 2018-02-18 NOTE — Telephone Encounter (Signed)
Ok thank you so much, I will forward this information to Dr. Ancil Boozer

## 2018-02-18 NOTE — Telephone Encounter (Signed)
Patient has already been informed.

## 2018-02-18 NOTE — Telephone Encounter (Signed)
We currently do not have any in the fridge. Patricia Chan has been calling since last week but we are not able to get any in at the moment. I have placed her name on the list for the sample

## 2018-02-18 NOTE — Telephone Encounter (Signed)
Notify patient.

## 2018-02-25 ENCOUNTER — Encounter: Payer: Self-pay | Admitting: Family Medicine

## 2018-02-25 ENCOUNTER — Ambulatory Visit (INDEPENDENT_AMBULATORY_CARE_PROVIDER_SITE_OTHER): Payer: Commercial Managed Care - PPO | Admitting: Family Medicine

## 2018-02-25 VITALS — BP 136/72 | HR 96 | Temp 98.4°F | Resp 16 | Ht 64.0 in | Wt 186.6 lb

## 2018-02-25 DIAGNOSIS — H9192 Unspecified hearing loss, left ear: Secondary | ICD-10-CM

## 2018-02-25 DIAGNOSIS — M1712 Unilateral primary osteoarthritis, left knee: Secondary | ICD-10-CM

## 2018-02-25 DIAGNOSIS — H6121 Impacted cerumen, right ear: Secondary | ICD-10-CM

## 2018-02-25 DIAGNOSIS — M7752 Other enthesopathy of left foot: Secondary | ICD-10-CM

## 2018-02-25 DIAGNOSIS — M779 Enthesopathy, unspecified: Secondary | ICD-10-CM

## 2018-02-25 MED ORDER — LIDOCAINE HCL (PF) 1 % IJ SOLN
2.0000 mL | Freq: Once | INTRAMUSCULAR | Status: AC
  Administered 2018-02-25: 2 mL

## 2018-02-25 MED ORDER — TRIAMCINOLONE ACETONIDE 40 MG/ML IJ SUSP
40.0000 mg | Freq: Once | INTRAMUSCULAR | Status: AC
  Administered 2018-02-25: 40 mg via INTRAMUSCULAR
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
## 2018-02-25 NOTE — Progress Notes (Signed)
kenl

## 2018-03-04 ENCOUNTER — Other Ambulatory Visit: Payer: Self-pay | Admitting: Family Medicine

## 2018-03-04 DIAGNOSIS — E785 Hyperlipidemia, unspecified: Principal | ICD-10-CM

## 2018-03-04 DIAGNOSIS — E1169 Type 2 diabetes mellitus with other specified complication: Secondary | ICD-10-CM

## 2018-03-18 ENCOUNTER — Encounter: Payer: Self-pay | Admitting: Podiatry

## 2018-03-18 ENCOUNTER — Ambulatory Visit (INDEPENDENT_AMBULATORY_CARE_PROVIDER_SITE_OTHER): Payer: Commercial Managed Care - PPO

## 2018-03-18 ENCOUNTER — Other Ambulatory Visit: Payer: Self-pay | Admitting: Podiatry

## 2018-03-18 ENCOUNTER — Ambulatory Visit: Payer: Commercial Managed Care - PPO

## 2018-03-18 ENCOUNTER — Encounter

## 2018-03-18 ENCOUNTER — Ambulatory Visit (INDEPENDENT_AMBULATORY_CARE_PROVIDER_SITE_OTHER): Payer: Commercial Managed Care - PPO | Admitting: Podiatry

## 2018-03-18 DIAGNOSIS — M7752 Other enthesopathy of left foot: Secondary | ICD-10-CM

## 2018-03-18 DIAGNOSIS — M7662 Achilles tendinitis, left leg: Secondary | ICD-10-CM

## 2018-03-18 MED ORDER — METHYLPREDNISOLONE 4 MG PO TBPK: ORAL_TABLET | ORAL | 0 refills | Status: DC

## 2018-03-20 MED ORDER — NONFORMULARY OR COMPOUNDED ITEM: 2 refills | Status: DC

## 2018-03-20 NOTE — Progress Notes (Signed)
   HPI: 62 year old female presenting today as a new patient with a chief complaint of stinging, burning and constant aching pain to the left heel that began one month ago secondary to an injury. She reports twisting the heel and felt a popping sensation at the time. She reports an associated nodule located on the achilles area. Walking and standing increases the pain. She has been taking Meloxicam and Tylenol for pain which provides some temporary relief. Patient is here for further evaluation and treatment.   Past Medical History:  Diagnosis Date  . Allergy   . Diabetes mellitus without complication ([place])   . Hyperlipidemia   . Hypertension   . Retinopathy   . Varicose veins       Physical Exam: General: The patient is alert and oriented x3 in no acute distress.  Dermatology: Skin is warm, dry and supple bilateral lower extremities. Negative for open lesions or macerations.  Vascular: Palpable pedal pulses bilaterally. No edema or erythema noted. Capillary refill within normal limits.  Neurological: Epicritic and protective threshold grossly intact bilaterally.   Musculoskeletal Exam: Pain on palpation noted to the posterior tubercle of the left calcaneus at the insertion of the Achilles tendon consistent with retrocalcaneal bursitis. Range of motion within normal limits. Muscle strength 5/5 in all muscle groups bilateral lower extremities.  Radiographic Exam:  Normal osseous mineralization. Joint spaces preserved. No fracture/dislocation/boney destruction.    Assessment: 1. Insertional Achilles tendinitis left 2. Retrocalcaneal bursitis   Plan of Care:  1. Patient was evaluated. Radiographs were reviewed today. 2. CAM boot dispensed. Weightbearing as tolerated.  3. Compression anklet dispensed.  4. Prescription for Medrol Dose Pak provided to patient. Resume taking Meloxicam after completion.  5. Prescription for pain cream to be dispensed by [facility].  6. Return  to clinic in 4 weeks.    Edrick Kins, DPM [facility]  Dr. Edrick Kins, [address]                                        [place], [place] [address]                Office [phone]  Fax [phone]

## 2018-03-24 ENCOUNTER — Telehealth: Payer: Self-pay | Admitting: *Deleted

## 2018-03-24 NOTE — Telephone Encounter (Signed)
I told pt she could take the tylenol with prednisone.

## 2018-03-24 NOTE — Telephone Encounter (Signed)
Pt states she forgot if she could take tylenol with prednisone.

## 2018-04-15 ENCOUNTER — Ambulatory Visit: Payer: Commercial Managed Care - PPO | Admitting: Podiatry

## 2018-04-15 ENCOUNTER — Ambulatory Visit (INDEPENDENT_AMBULATORY_CARE_PROVIDER_SITE_OTHER): Payer: Commercial Managed Care - PPO | Admitting: Podiatry

## 2018-04-15 ENCOUNTER — Encounter: Payer: Self-pay | Admitting: Podiatry

## 2018-04-15 DIAGNOSIS — M7662 Achilles tendinitis, left leg: Secondary | ICD-10-CM

## 2018-04-17 NOTE — Progress Notes (Signed)
   HPI: 62 year old female presenting today for follow up evaluation of insertional Achilles tendinitis of the left lower extremity. She states the pain has resolved for the most part. She has been using the CAM boot as directed and is eager to start walking again. She denies any modifying factors or new complaints. Patient is here for further evaluation and treatment.   Past Medical History:  Diagnosis Date  . Allergy   . Diabetes mellitus without complication ([place])   . Hyperlipidemia   . Hypertension   . Retinopathy   . Varicose veins      Physical Exam: General: The patient is alert and oriented x3 in no acute distress.  Dermatology: Skin is warm, dry and supple bilateral lower extremities. Negative for open lesions or macerations.  Vascular: Palpable pedal pulses bilaterally. No edema or erythema noted. Capillary refill within normal limits.  Neurological: Epicritic and protective threshold grossly intact bilaterally.   Musculoskeletal Exam: Range of motion within normal limits to all pedal and ankle joints bilateral. Muscle strength 5/5 in all groups bilateral.   Assessment: 1. Insertional Achilles tendinitis left - resolved    Plan of Care:  1. Patient evaluated.  2. Resume wearing good shoe gear. Discontinue using CAM boot.  3. Silicone heel sleeve dispensed.  4. Continue taking Meloxicam daily for two weeks.  5. Return to clinic as needed.       Edrick Kins, DPM [facility]  Dr. Edrick Kins, DPM    [address], [place] [address]                Office [phone]  Fax [phone]

## 2018-04-28 ENCOUNTER — Encounter: Payer: Self-pay | Admitting: Family Medicine

## 2018-04-28 ENCOUNTER — Ambulatory Visit (INDEPENDENT_AMBULATORY_CARE_PROVIDER_SITE_OTHER): Payer: Commercial Managed Care - PPO | Admitting: Family Medicine

## 2018-04-28 ENCOUNTER — Other Ambulatory Visit: Payer: Self-pay

## 2018-04-28 VITALS — BP 120/72 | HR 100 | Temp 97.6°F | Resp 16 | Ht 64.0 in | Wt 182.3 lb

## 2018-04-28 DIAGNOSIS — J3089 Other allergic rhinitis: Secondary | ICD-10-CM

## 2018-04-28 DIAGNOSIS — Z0000 Encounter for general adult medical examination without abnormal findings: Secondary | ICD-10-CM | POA: Diagnosis not present

## 2018-04-28 DIAGNOSIS — R197 Diarrhea, unspecified: Secondary | ICD-10-CM

## 2018-04-28 DIAGNOSIS — Z1239 Encounter for other screening for malignant neoplasm of breast: Secondary | ICD-10-CM

## 2018-04-28 DIAGNOSIS — Z1231 Encounter for screening mammogram for malignant neoplasm of breast: Secondary | ICD-10-CM

## 2018-04-28 MED ORDER — LORATADINE 10 MG PO TABS: 10.0000 mg | ORAL_TABLET | Freq: Every day | ORAL | 1 refills | Status: DC
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
## 2018-04-28 NOTE — Telephone Encounter (Signed)
Refill request for general medication. Loratadine to [facility].   Last office visit 04/28/2018   Follow up on 06/28/2018

## 2018-05-09 ENCOUNTER — Other Ambulatory Visit: Payer: Self-pay | Admitting: Family Medicine

## 2018-05-09 DIAGNOSIS — E113213 Type 2 diabetes mellitus with mild nonproliferative diabetic retinopathy with macular edema, bilateral: Secondary | ICD-10-CM

## 2018-05-09 DIAGNOSIS — Z794 Long term (current) use of insulin: Principal | ICD-10-CM

## 2018-05-09 MED ORDER — INSULIN DEGLUDEC-LIRAGLUTIDE 100-3.6 UNIT-MG/ML ~~LOC~~ SOPN: 50.0000 [IU] | PEN_INJECTOR | Freq: Every day | SUBCUTANEOUS | 0 refills | Status: DC

## 2018-05-09 NOTE — Telephone Encounter (Signed)
Request for diabetes medication. Xultophy to [facility].   Last office visit pertaining to diabetes: 02/11/2018   Lab Results  Component Value Date   HGBA1C 6.3 (H) 02/11/2018     Follow up on 06/18/2018

## 2018-05-09 NOTE — Telephone Encounter (Signed)
Copied from [place] [id number]. Topic: Quick Communication - Rx Refill/Question >> May 09, 2018 11:03 AM Marin Olp L wrote: Medication: Insulin Degludec-Liraglutide (XULTOPHY) 100-3.6 UNIT-MG/ML SOPN (Would like someone to call her and let her know is samples are available?)  Has the patient contacted their pharmacy? Yes.   (Agent: If no, request that the patient contact the pharmacy for the refill.) (Agent: If yes, when and what did the pharmacy advise?)  Preferred Pharmacy (with phone number or street name): [facility] (N), [place] [facility] [address] [place] [address]) [place] [address] Phone: [phone] Fax: [phone]  Agent: Please be advised that RX refills may take up to 3 business days. We ask that you follow-up with your pharmacy.

## 2018-05-12 ENCOUNTER — Telehealth: Payer: Self-pay

## 2018-05-12 NOTE — Telephone Encounter (Signed)
Copied from [place] [id number]. Topic: General - Other >> May 12, 2018  8:14 AM Carolyn Stare wrote:     Pt ask if we have samples Insulin Degludec-Liraglutide (XULTOPHY) 100-3.6 UNIT-MG/ML SOPN    she is out of the med and need a samples today  Please advise if it is ok to give samples of this medication.

## 2018-05-12 NOTE — Telephone Encounter (Signed)
Patient dropped by and was given 1 sample until she can get paid on Friday.

## 2018-05-12 NOTE — Telephone Encounter (Signed)
Okay to give it if we have more than one box in the refrigerator

## 2018-06-18 ENCOUNTER — Ambulatory Visit (INDEPENDENT_AMBULATORY_CARE_PROVIDER_SITE_OTHER): Payer: Commercial Managed Care - PPO | Admitting: Family Medicine

## 2018-06-18 ENCOUNTER — Encounter: Payer: Self-pay | Admitting: Family Medicine

## 2018-06-18 VITALS — BP 118/68 | HR 97 | Temp 98.3°F | Resp 16 | Ht 64.0 in | Wt 181.6 lb

## 2018-06-18 DIAGNOSIS — I10 Essential (primary) hypertension: Secondary | ICD-10-CM

## 2018-06-18 DIAGNOSIS — E559 Vitamin D deficiency, unspecified: Secondary | ICD-10-CM

## 2018-06-18 DIAGNOSIS — E1169 Type 2 diabetes mellitus with other specified complication: Secondary | ICD-10-CM | POA: Diagnosis not present

## 2018-06-18 DIAGNOSIS — J3089 Other allergic rhinitis: Secondary | ICD-10-CM | POA: Diagnosis not present

## 2018-06-18 DIAGNOSIS — Z794 Long term (current) use of insulin: Secondary | ICD-10-CM | POA: Diagnosis not present

## 2018-06-18 DIAGNOSIS — D638 Anemia in other chronic diseases classified elsewhere: Secondary | ICD-10-CM

## 2018-06-18 DIAGNOSIS — E113213 Type 2 diabetes mellitus with mild nonproliferative diabetic retinopathy with macular edema, bilateral: Secondary | ICD-10-CM

## 2018-06-18 DIAGNOSIS — E785 Hyperlipidemia, unspecified: Secondary | ICD-10-CM

## 2018-06-18 DIAGNOSIS — R232 Flushing: Secondary | ICD-10-CM

## 2018-06-18 DIAGNOSIS — F325 Major depressive disorder, single episode, in full remission: Secondary | ICD-10-CM

## 2018-06-18 LAB — POCT GLYCOSYLATED HEMOGLOBIN (HGB A1C): HEMOGLOBIN A1C: 6.3 % — AB (ref 4.0–5.6)

## 2018-06-18 MED ORDER — INSULIN DEGLUDEC-LIRAGLUTIDE 100-3.6 UNIT-MG/ML ~~LOC~~ SOPN: 50.0000 [IU] | PEN_INJECTOR | Freq: Every day | SUBCUTANEOUS | 0 refills | Status: DC

## 2018-06-18 MED ORDER — LOSARTAN POTASSIUM 50 MG PO TABS: 50.0000 mg | ORAL_TABLET | Freq: Every day | ORAL | 0 refills | Status: DC

## 2018-06-18 MED ORDER — ATORVASTATIN CALCIUM 40 MG PO TABS: 40.0000 mg | ORAL_TABLET | Freq: Every day | ORAL | 1 refills | Status: DC

## 2018-06-18 MED ORDER — CLONIDINE HCL 0.1 MG PO TABS: 0.1000 mg | ORAL_TABLET | Freq: Every day | ORAL | 1 refills | Status: DC
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
## 2018-06-19 ENCOUNTER — Other Ambulatory Visit: Payer: Self-pay | Admitting: Family Medicine

## 2018-06-19 DIAGNOSIS — Z1231 Encounter for screening mammogram for malignant neoplasm of breast: Secondary | ICD-10-CM

## 2018-06-30 ENCOUNTER — Other Ambulatory Visit: Payer: Self-pay | Admitting: Family Medicine

## 2018-06-30 DIAGNOSIS — J3089 Other allergic rhinitis: Secondary | ICD-10-CM

## 2018-07-28 ENCOUNTER — Ambulatory Visit (INDEPENDENT_AMBULATORY_CARE_PROVIDER_SITE_OTHER): Payer: Commercial Managed Care - PPO | Admitting: Family Medicine

## 2018-07-28 ENCOUNTER — Encounter: Payer: Self-pay | Admitting: Family Medicine

## 2018-07-28 VITALS — BP 126/78 | HR 94 | Temp 97.9°F | Resp 16 | Ht 64.0 in | Wt 184.1 lb

## 2018-07-28 DIAGNOSIS — J209 Acute bronchitis, unspecified: Secondary | ICD-10-CM | POA: Diagnosis not present

## 2018-07-28 DIAGNOSIS — J069 Acute upper respiratory infection, unspecified: Secondary | ICD-10-CM

## 2018-07-28 MED ORDER — BENZONATATE 100 MG PO CAPS: 100.0000 mg | ORAL_CAPSULE | Freq: Two times a day (BID) | ORAL | 0 refills | Status: DC | PRN

## 2018-07-28 MED ORDER — UMECLIDINIUM BROMIDE 62.5 MCG/INH IN AEPB: 1.0000 | INHALATION_SPRAY | Freq: Every day | RESPIRATORY_TRACT | 0 refills | Status: DC
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
## 2018-08-04 ENCOUNTER — Ambulatory Visit
Admission: RE | Admit: 2018-08-04 | Discharge: 2018-08-04 | Disposition: A | Discharge status: Home / Self Care | Payer: Commercial Managed Care - PPO | Source: Ambulatory Visit | Attending: Family Medicine | Admitting: Family Medicine

## 2018-08-04 DIAGNOSIS — Z1231 Encounter for screening mammogram for malignant neoplasm of breast: Secondary | ICD-10-CM

## 2018-09-23 ENCOUNTER — Encounter: Payer: Self-pay | Admitting: Podiatry

## 2018-09-23 ENCOUNTER — Ambulatory Visit (INDEPENDENT_AMBULATORY_CARE_PROVIDER_SITE_OTHER): Payer: Commercial Managed Care - PPO | Admitting: Podiatry

## 2018-09-23 DIAGNOSIS — M7662 Achilles tendinitis, left leg: Secondary | ICD-10-CM | POA: Diagnosis not present

## 2018-09-23 MED ORDER — MELOXICAM 15 MG PO TABS: 15.0000 mg | ORAL_TABLET | Freq: Every day | ORAL | 1 refills | Status: DC

## 2018-09-25 NOTE — Progress Notes (Signed)
   HPI: 63 year old female presenting today for follow up evaluation of insertional Achilles tendinitis of the left lower extremity. She reports worsening burning, stinging pain of the left heel that began 3-4 weeks ago. She reports associated swelling. Wearing shoes and walking increases the pain. She has been taking OTC Tylenol for treatment. Patient is here for further evaluation and treatment.   Past Medical History:  Diagnosis Date  . Allergy   . Diabetes mellitus without complication ([place])   . Hyperlipidemia   . Hypertension   . Retinopathy   . Varicose veins      Physical Exam: General: The patient is alert and oriented x3 in no acute distress.  Dermatology: Skin is warm, dry and supple bilateral lower extremities. Negative for open lesions or macerations.  Vascular: Palpable pedal pulses bilaterally. No edema or erythema noted. Capillary refill within normal limits.  Neurological: Epicritic and protective threshold grossly intact bilaterally.   Musculoskeletal Exam: Pain on palpation noted to the posterior tubercle of the left calcaneus at the insertion of the Achilles tendon consistent with retrocalcaneal bursitis. Range of motion within normal limits to all pedal and ankle joints bilateral. Muscle strength 5/5 in all groups bilateral.   Assessment: 1. Insertional Achilles tendinitis left - recurred    Plan of Care:  1. Patient evaluated.  2. Injection of 0.5 mLs of Cel[place] Soluspan injected into the lateral aspect of the retrocalcaneal bursa. Care was taken to avoid direct injection into the tendon. 3. Prescription for Meloxicam provided to patient. 4. Silicone heel cushion dispensed.  5. Recommended good shoe gear.  6. Return to clinic in 4 weeks.   Works at [employer].     Edrick Kins, DPM [facility]  Dr. Edrick Kins, DPM    [address], [place] [address]                Office [phone]  Fax [phone]

## 2018-10-15 ENCOUNTER — Ambulatory Visit: Payer: Commercial Managed Care - PPO | Admitting: Family Medicine

## 2018-10-15 ENCOUNTER — Encounter: Payer: Self-pay | Admitting: Family Medicine

## 2018-10-15 ENCOUNTER — Other Ambulatory Visit: Payer: Self-pay

## 2018-10-15 VITALS — BP 162/82 | HR 105 | Temp 97.5°F | Resp 16 | Ht 64.0 in | Wt 187.9 lb

## 2018-10-15 DIAGNOSIS — E1169 Type 2 diabetes mellitus with other specified complication: Secondary | ICD-10-CM

## 2018-10-15 DIAGNOSIS — E113213 Type 2 diabetes mellitus with mild nonproliferative diabetic retinopathy with macular edema, bilateral: Secondary | ICD-10-CM | POA: Diagnosis not present

## 2018-10-15 DIAGNOSIS — F325 Major depressive disorder, single episode, in full remission: Secondary | ICD-10-CM

## 2018-10-15 DIAGNOSIS — Z794 Long term (current) use of insulin: Secondary | ICD-10-CM

## 2018-10-15 DIAGNOSIS — E785 Hyperlipidemia, unspecified: Secondary | ICD-10-CM

## 2018-10-15 DIAGNOSIS — E538 Deficiency of other specified B group vitamins: Secondary | ICD-10-CM

## 2018-10-15 DIAGNOSIS — E669 Obesity, unspecified: Secondary | ICD-10-CM

## 2018-10-15 DIAGNOSIS — E1159 Type 2 diabetes mellitus with other circulatory complications: Secondary | ICD-10-CM

## 2018-10-15 DIAGNOSIS — I10 Essential (primary) hypertension: Secondary | ICD-10-CM | POA: Diagnosis not present

## 2018-10-15 DIAGNOSIS — J3089 Other allergic rhinitis: Secondary | ICD-10-CM

## 2018-10-15 LAB — POCT GLYCOSYLATED HEMOGLOBIN (HGB A1C): Hemoglobin A1C: 6.1 % — AB (ref 4.0–5.6)

## 2018-10-15 MED ORDER — ATORVASTATIN CALCIUM 40 MG PO TABS: 40.0000 mg | ORAL_TABLET | Freq: Every day | ORAL | 1 refills | Status: DC

## 2018-10-15 MED ORDER — LOSARTAN POTASSIUM 100 MG PO TABS: 100.0000 mg | ORAL_TABLET | Freq: Every day | ORAL | 0 refills | Status: DC

## 2018-10-15 MED ORDER — AZELASTINE HCL 0.1 % NA SOLN: 2.0000 | Freq: Every day | NASAL | 2 refills | Status: DC

## 2018-10-15 MED ORDER — INSULIN DEGLUDEC-LIRAGLUTIDE 100-3.6 UNIT-MG/ML ~~LOC~~ SOPN: 40.0000 [IU] | PEN_INJECTOR | Freq: Every day | SUBCUTANEOUS | 0 refills | Status: DC

## 2018-10-15 NOTE — Patient Instructions (Signed)
You need to take a total of 100 mg of Losartan in the morning  Go down on Xultophy to 40 units and if glucose remains low ( below 140 in am's) you can go down to 38 units Take clonidine 0.1 mg at night for hot flashes
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
## 2018-10-16 LAB — MICROALBUMIN / CREATININE URINE RATIO
Creatinine, Urine: 14 mg/dL — ABNORMAL LOW (ref 20–275)
Microalb Creat Ratio: 29 mcg/mg creat (ref <30)
Microalb, Ur: 0.4 mg/dL

## 2018-10-21 ENCOUNTER — Ambulatory Visit (INDEPENDENT_AMBULATORY_CARE_PROVIDER_SITE_OTHER): Payer: Commercial Managed Care - PPO | Admitting: Podiatry

## 2018-10-21 ENCOUNTER — Encounter: Payer: Self-pay | Admitting: Podiatry

## 2018-10-21 ENCOUNTER — Other Ambulatory Visit: Payer: Self-pay

## 2018-10-21 DIAGNOSIS — M722 Plantar fascial fibromatosis: Secondary | ICD-10-CM | POA: Diagnosis not present

## 2018-10-21 DIAGNOSIS — G5792 Unspecified mononeuropathy of left lower limb: Secondary | ICD-10-CM

## 2018-10-21 DIAGNOSIS — M7662 Achilles tendinitis, left leg: Secondary | ICD-10-CM | POA: Diagnosis not present

## 2018-10-21 MED ORDER — GABAPENTIN 100 MG PO CAPS: 100.0000 mg | ORAL_CAPSULE | Freq: Three times a day (TID) | ORAL | 1 refills | Status: DC

## 2018-10-23 NOTE — Progress Notes (Signed)
   HPI: 63 year old female presenting today for follow up evaluation of insertional Achilles tendinitis of the left lower extremity. She reports continued burning, stinging pain of the left heel that is exacerbated with walking. She has been taking Meloxicam that has provided some temporary relief. Patient is here for further evaluation and treatment.   Past Medical History:  Diagnosis Date  . Allergy   . Diabetes mellitus without complication ([place])   . Hyperlipidemia   . Hypertension   . Retinopathy   . Varicose veins      Physical Exam: General: The patient is alert and oriented x3 in no acute distress.  Dermatology: Skin is warm, dry and supple bilateral lower extremities. Negative for open lesions or macerations.  Vascular: Palpable pedal pulses bilaterally. No edema or erythema noted. Capillary refill within normal limits.  Neurological: Epicritic and protective threshold grossly intact bilaterally.   Musculoskeletal Exam: Pain on palpation noted to the posterior tubercle of the left calcaneus at the insertion of the Achilles tendon consistent with retrocalcaneal bursitis as well as the left heel along the plantar fascia. Range of motion within normal limits to all pedal and ankle joints bilateral. Muscle strength 5/5 in all groups bilateral.   Assessment: 1. Insertional Achilles tendinitis left - recurred  2. Plantar fasciitis left  3. Left heel neuritis    Plan of Care:  1. Patient evaluated.  2. Order for physical therapy at [facility].  3. Prescription for Gabapentin 100 mg TID provided to patient.  4. Recommended good shoe gear.  5. Return to clinic in 4 weeks.   Works at [employer].     Edrick Kins, DPM [facility]  Dr. Edrick Kins, DPM    [address], [place] [address]                Office [phone]  Fax [phone]

## 2018-12-02 ENCOUNTER — Ambulatory Visit (INDEPENDENT_AMBULATORY_CARE_PROVIDER_SITE_OTHER): Payer: Commercial Managed Care - PPO | Admitting: Podiatry

## 2018-12-02 ENCOUNTER — Ambulatory Visit: Payer: Commercial Managed Care - PPO | Admitting: Podiatry

## 2018-12-02 ENCOUNTER — Other Ambulatory Visit: Payer: Self-pay

## 2018-12-02 DIAGNOSIS — M722 Plantar fascial fibromatosis: Secondary | ICD-10-CM

## 2018-12-02 DIAGNOSIS — M7662 Achilles tendinitis, left leg: Secondary | ICD-10-CM

## 2018-12-02 DIAGNOSIS — G5792 Unspecified mononeuropathy of left lower limb: Secondary | ICD-10-CM

## 2018-12-02 MED ORDER — GABAPENTIN 100 MG PO CAPS: 100.0000 mg | ORAL_CAPSULE | Freq: Three times a day (TID) | ORAL | 1 refills | Status: DC

## 2018-12-02 NOTE — Progress Notes (Signed)
   HPI: 63 year old female presenting today for follow up evaluation of insertional Achilles tendinitis as well as a plantar heel neuritis to the left lower extremity.  Patient states that she completed 10 visits with physical therapy, however over the course of the visit she developed edema to the left lower extremity.  Patient can discontinue taking meloxicam as advised by PCP due to increased blood pressure.  She continues to have some tenderness when on her feet for long periods of time.  She presents for further treatment evaluation  Past Medical History:  Diagnosis Date  . Allergy   . Diabetes mellitus without complication ([place])   . Hyperlipidemia   . Hypertension   . Retinopathy   . Varicose veins      Physical Exam: General: The patient is alert and oriented x3 in no acute distress.  Dermatology: Skin is warm, dry and supple bilateral lower extremities. Negative for open lesions or macerations.  Vascular: Palpable pedal pulses bilaterally. No edema or erythema noted. Capillary refill within normal limits.  Neurological: Epicritic and protective threshold grossly intact bilaterally.   Musculoskeletal Exam: Pain on palpation noted to the posterior tubercle of the left calcaneus at the insertion of the Achilles tendon consistent with retrocalcaneal bursitis as well as the left heel along the plantar fascia. Range of motion within normal limits to all pedal and ankle joints bilateral. Muscle strength 5/5 in all groups bilateral.   Assessment: 1. Insertional Achilles tendinitis left 2. Plantar fasciitis left  3. Left heel neuritis    Plan of Care:  1. Patient evaluated.  2.  Discontinue physical therapy since patient feels that they are not getting any benefit from the visits. 3.  Discontinue meloxicam due to increased blood pressure 4.  Refill prescription for gabapentin 100 mg 3 times daily.  Patient states it was helping significantly however she lost the prescription is no  longer currently taking it 5.  Continue wearing good supportive shoe gear and stretching exercises 6.  Return to clinic as needed  Works at [employer].     Edrick Kins, DPM [facility]  Dr. Edrick Kins, DPM    [address], [place] [address]                Office [phone]  Fax [phone]

## 2018-12-03 ENCOUNTER — Other Ambulatory Visit: Payer: Self-pay | Admitting: Family Medicine

## 2018-12-03 DIAGNOSIS — E113213 Type 2 diabetes mellitus with mild nonproliferative diabetic retinopathy with macular edema, bilateral: Secondary | ICD-10-CM

## 2018-12-03 DIAGNOSIS — Z794 Long term (current) use of insulin: Principal | ICD-10-CM

## 2018-12-03 MED ORDER — INSULIN DEGLUDEC-LIRAGLUTIDE 100-3.6 UNIT-MG/ML ~~LOC~~ SOPN: 40.0000 [IU] | PEN_INJECTOR | Freq: Every day | SUBCUTANEOUS | 3 refills | Status: DC

## 2018-12-03 NOTE — Telephone Encounter (Signed)
Copied from [place] [id number]. Topic: Quick Communication - Rx Refill/Question >> Dec 03, 2018  2:49 PM Wynetta Emery, [place] C wrote: Medication: Insulin Degludec-Liraglutide (XULTOPHY) 100-3.6 UNIT-MG/ML SOPN   Has the patient contacted their pharmacy? Yes   (Agent: If no, request that the patient contact the pharmacy for the refill.) (Agent: If yes, when and what did the pharmacy advise?)  Preferred Pharmacy (with phone number or street name): [facility] [place] [phone] (Phone) [phone] (Fax)    Agent: Please be advised that RX refills may take up to 3 business days. We ask that you follow-up with your pharmacy.

## 2018-12-03 NOTE — Telephone Encounter (Signed)
Refill request for diabetic medication:   Xultophy 100-3.6  Last office visit pertaining to diabetes: 10/15/2018   Lab Results  Component Value Date   HGBA1C 6.1 (A) 10/15/2018   Follow-ups on file. 02/11/2019

## 2018-12-26 LAB — HM DIABETES EYE EXAM

## 2019-02-11 ENCOUNTER — Encounter: Payer: Self-pay | Admitting: Family Medicine

## 2019-02-11 ENCOUNTER — Ambulatory Visit: Payer: Commercial Managed Care - PPO | Admitting: Family Medicine

## 2019-02-11 ENCOUNTER — Other Ambulatory Visit: Payer: Self-pay

## 2019-02-11 VITALS — BP 110/70 | HR 90 | Temp 96.9°F | Resp 16 | Ht 64.0 in | Wt 191.2 lb

## 2019-02-11 DIAGNOSIS — E538 Deficiency of other specified B group vitamins: Secondary | ICD-10-CM | POA: Diagnosis not present

## 2019-02-11 DIAGNOSIS — E1142 Type 2 diabetes mellitus with diabetic polyneuropathy: Secondary | ICD-10-CM

## 2019-02-11 DIAGNOSIS — Z794 Long term (current) use of insulin: Secondary | ICD-10-CM

## 2019-02-11 DIAGNOSIS — F325 Major depressive disorder, single episode, in full remission: Secondary | ICD-10-CM | POA: Diagnosis not present

## 2019-02-11 DIAGNOSIS — E1169 Type 2 diabetes mellitus with other specified complication: Secondary | ICD-10-CM | POA: Diagnosis not present

## 2019-02-11 DIAGNOSIS — I10 Essential (primary) hypertension: Secondary | ICD-10-CM

## 2019-02-11 DIAGNOSIS — E559 Vitamin D deficiency, unspecified: Secondary | ICD-10-CM

## 2019-02-11 DIAGNOSIS — E785 Hyperlipidemia, unspecified: Secondary | ICD-10-CM

## 2019-02-11 DIAGNOSIS — R400 Somnolence: Secondary | ICD-10-CM

## 2019-02-11 DIAGNOSIS — E113213 Type 2 diabetes mellitus with mild nonproliferative diabetic retinopathy with macular edema, bilateral: Secondary | ICD-10-CM | POA: Diagnosis not present

## 2019-02-11 DIAGNOSIS — R0683 Snoring: Secondary | ICD-10-CM

## 2019-02-11 LAB — POCT GLYCOSYLATED HEMOGLOBIN (HGB A1C): HbA1c, POC (controlled diabetic range): 6.7 % (ref 0.0–7.0)

## 2019-02-11 MED ORDER — GABAPENTIN 300 MG PO CAPS: 300.0000 mg | ORAL_CAPSULE | Freq: Every day | ORAL | 1 refills | Status: DC

## 2019-02-11 MED ORDER — LOSARTAN POTASSIUM-HCTZ 50-12.5 MG PO TABS: 1.0000 | ORAL_TABLET | Freq: Every day | ORAL | 0 refills | Status: DC
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
## 2019-02-11 NOTE — Patient Instructions (Signed)
Call insurance and ask if you can have a home sleep study and which company we can schedule you with

## 2019-02-12 LAB — VITAMIN B12: Vitamin B-12: 1189 pg/mL — ABNORMAL HIGH (ref 200–1100)

## 2019-02-12 LAB — COMPLETE METABOLIC PANEL WITH GFR
AG Ratio: 1.8 (calc) (ref 1.0–2.5)
ALT: 20 U/L (ref 6–29)
AST: 15 U/L (ref 10–35)
Albumin: 4.3 g/dL (ref 3.6–5.1)
Alkaline phosphatase (APISO): 94 U/L (ref 37–153)
BUN: 21 mg/dL (ref 7–25)
CO2: 28 mmol/L (ref 20–32)
Calcium: 9.7 mg/dL (ref 8.6–10.4)
Chloride: 106 mmol/L (ref 98–110)
Creat: 0.88 mg/dL (ref 0.50–0.99)
GFR, Est African American: 81 mL/min/{1.73_m2} (ref >=60)
GFR, Est Non African American: 70 mL/min/{1.73_m2} (ref >=60)
Globulin: 2.4 g/dL (calc) (ref 1.9–3.7)
Glucose, Bld: 85 mg/dL (ref 65–99)
Potassium: 3.7 mmol/L (ref 3.5–5.3)
Sodium: 141 mmol/L (ref 135–146)
Total Bilirubin: 0.3 mg/dL (ref 0.2–1.2)
Total Protein: 6.7 g/dL (ref 6.1–8.1)

## 2019-02-12 LAB — CBC WITH DIFFERENTIAL/PLATELET
Absolute Monocytes: 445 cells/uL (ref 200–950)
Basophils Absolute: 30 cells/uL (ref 0–200)
Basophils Relative: 0.6 %
Eosinophils Absolute: 220 cells/uL (ref 15–500)
Eosinophils Relative: 4.4 %
HCT: 33 % — ABNORMAL LOW (ref 35.0–45.0)
Hemoglobin: 11.3 g/dL — ABNORMAL LOW (ref 11.7–15.5)
Lymphs Abs: 1460 cells/uL (ref 850–3900)
MCH: 31.4 pg (ref 27.0–33.0)
MCHC: 34.2 g/dL (ref 32.0–36.0)
MCV: 91.7 fL (ref 80.0–100.0)
MPV: 9.9 fL (ref 7.5–12.5)
Monocytes Relative: 8.9 %
Neutro Abs: 2845 cells/uL (ref 1500–7800)
Neutrophils Relative %: 56.9 %
Platelets: 303 10*3/uL (ref 140–400)
RBC: 3.6 10*6/uL — ABNORMAL LOW (ref 3.80–5.10)
RDW: 13.2 % (ref 11.0–15.0)
Total Lymphocyte: 29.2 %
WBC: 5 10*3/uL (ref 3.8–10.8)

## 2019-02-12 LAB — LIPID PANEL
Cholesterol: 130 mg/dL (ref <200)
HDL: 41 mg/dL — ABNORMAL LOW (ref >=50)
LDL Cholesterol (Calc): 70 mg/dL (calc)
Non-HDL Cholesterol (Calc): 89 mg/dL (calc) (ref <130)
Total CHOL/HDL Ratio: 3.2 (calc) (ref <5.0)
Triglycerides: 100 mg/dL (ref <150)

## 2019-02-12 LAB — VITAMIN D 25 HYDROXY (VIT D DEFICIENCY, FRACTURES): Vit D, 25-Hydroxy: 48 ng/mL (ref 30–100)

## 2019-02-17 ENCOUNTER — Telehealth: Payer: Self-pay | Admitting: Family Medicine

## 2019-02-17 DIAGNOSIS — R0683 Snoring: Secondary | ICD-10-CM

## 2019-02-17 DIAGNOSIS — E669 Obesity, unspecified: Secondary | ICD-10-CM

## 2019-02-17 DIAGNOSIS — R400 Somnolence: Secondary | ICD-10-CM

## 2019-02-17 NOTE — Telephone Encounter (Signed)
Pt stated her insurance will pay for a sleep study but pt want to be called [facility] any referral in placed.

## 2019-02-18 NOTE — Telephone Encounter (Signed)
I contacted the patient and she stated that she wanted to go somewhere in [place].  Referral will be sent to [facility]

## 2019-02-23 ENCOUNTER — Telehealth: Payer: Self-pay

## 2019-02-23 NOTE — Telephone Encounter (Signed)
Copied from [place] [id number]. Topic: Appointment Scheduling - Scheduling Inquiry for Clinic >> Feb 23, 2019  9:22 AM Lennox Solders wrote: Reason for CRM:  pt is having right thigh pain this started after she had last office visit. I called office 3x

## 2019-02-24 ENCOUNTER — Ambulatory Visit (INDEPENDENT_AMBULATORY_CARE_PROVIDER_SITE_OTHER): Payer: Commercial Managed Care - PPO | Admitting: Family Medicine

## 2019-02-24 ENCOUNTER — Encounter: Payer: Self-pay | Admitting: Family Medicine

## 2019-02-24 VITALS — BP 114/67 | HR 85 | Temp 97.6°F | Wt 189.0 lb

## 2019-02-24 DIAGNOSIS — M79651 Pain in right thigh: Secondary | ICD-10-CM

## 2019-02-24 MED ORDER — BACLOFEN 10 MG PO TABS: 10.0000 mg | ORAL_TABLET | Freq: Three times a day (TID) | ORAL | 0 refills | Status: DC | PRN

## 2019-02-24 MED ORDER — MELOXICAM 15 MG PO TABS: 15.0000 mg | ORAL_TABLET | Freq: Every day | ORAL | 0 refills | Status: DC

## 2019-02-24 MED ORDER — TRAMADOL HCL 50 MG PO TABS: 50.0000 mg | ORAL_TABLET | Freq: Three times a day (TID) | ORAL | 0 refills | Status: AC | PRN

## 2019-02-24 NOTE — Progress Notes (Signed)
Name: Patricia Chan   MRN: [id number]    DOB: [birth year]   Date:02/24/2019       Progress Note  Subjective  Chief Complaint  Chief Complaint  Patient presents with  . Leg Pain    right thigh x 2 weeks. She has swelling and pain. Difficult to walk after sitting for awhile.    I connected with  Patricia Chan  on 02/24/19 at 11:40 AM EDT by a video enabled telemedicine application and verified that I am speaking with the correct person using two identifiers.  I discussed the limitations of evaluation and management by telemedicine and the availability of in person appointments. The patient expressed understanding and agreed to proceed. Staff also discussed with the patient that there may be a patient responsible charge related to this service. Patient Location: at clients home  Provider Location: [facility]   HPI  Right thigh pain: she states her right leg started to bother her about 2 weeks ago but getting progressively worse, she states now she needs to pause when she first gets out of her car or when she stands from sitting position. She denies a rash, no saddle anesthesia, she has pain on inner thigh, affecting her gait a little. Only taking tylenol. Discussed difficulty visit without proper exam, we will try nsaid's pain medication and muscle relaxer but if not better refer to Ortho   Patient Active Problem List   Diagnosis Date Noted  . Refusal of blood transfusions as patient is Jehovah's Witness 04/25/2017  . Obesity, diabetes, and hypertension syndrome ([place]) 06/05/2016  . Snoring 03/05/2016  . Osteoarthritis of left knee 07/28/2015  . Chronic knee pain 04/14/2015  . Benign essential HTN 01/11/2015  . Depression, major, single episode, mild ([place]) 01/11/2015  . Diabetes mellitus type 2 with retinopathy ([place]) 01/11/2015  . Dyslipidemia associated with type 2 diabetes mellitus ([place]) 01/11/2015  . History of anemia 01/11/2015  . Arthritis of shoulder  region, degenerative 01/11/2015  . 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 Overweight (BMI 25.0-29.9) 01/11/2015  . Perennial allergic rhinitis 01/11/2015    Social History   Tobacco Use  . Smoking status: Never Smoker  . Smokeless tobacco: Never Used  Substance Use Topics  . Alcohol use: Yes    Alcohol/week: 0.0 standard drinks    Comment: rarely     Current Outpatient Medications:  .  acetaminophen (TYLENOL) 500 MG tablet, Take 1 tablet (500 mg total) by mouth every 8 (eight) hours as needed., Disp: 90 tablet, Rfl: 0 .  aspirin 81 MG tablet, 1 tablet daily., Disp: , Rfl:  .  atorvastatin (LIPITOR) 40 MG tablet, Take 1 tablet (40 mg total) by mouth daily., Disp: 90 tablet, Rfl: 1 .  azelastine (ASTELIN) 0.1 % nasal spray, Place 2 sprays into both nostrils daily. Use in each nostril as directed, Disp: 30 mL, Rfl: 2 .  Cholecalciferol (VITAMIN D) 2000 units CAPS, Take 1 capsule by mouth daily., Disp: , Rfl:  .  cloNIDine (CATAPRES) 0.1 MG tablet, Take 1 tablet (0.1 mg total) by mouth at bedtime., Disp: 90 tablet, Rfl: 1 .  Cyanocobalamin (B-12) 1000 MCG SUBL, Place 1 tablet under the tongue daily., Disp: 30 each, Rfl: 5 .  Fluocinolone Acetonide Body 0.01 % OIL, Apply to scalp nightly until scaling is gone., Disp: , Rfl:  .  fluocinonide (LIDEX) 0.05 % external solution, Apply to psoriasis in scalp twice daily, Disp: , Rfl:  .  fluticasone (FLONASE) 50 MCG/ACT nasal spray, USE  2 SPRAY(S) IN EACH NOSTRIL ONCE DAILY, Disp: 48 g, Rfl: 1 .  gabapentin (NEURONTIN) 300 MG capsule, Take 1 capsule (300 mg total) by mouth at bedtime., Disp: 90 capsule, Rfl: 1 .  Insulin Degludec-Liraglutide (XULTOPHY) 100-3.6 UNIT-MG/ML SOPN, Inject 40 Units into the skin daily., Disp: 18 mL, Rfl: 3 .  Insulin Pen Needle (NOVOFINE AUTOCOVER) 30G X 8 MM MISC, Inject 10 each into the skin as needed., Disp: 100 each, Rfl: 1 .  losartan-hydrochlorothiazide (HYZAAR) 50-12.5 MG tablet, Take 1 tablet by mouth daily., Disp: 90  tablet, Rfl: 0 .  triamcinolone cream (KENALOG) 0.1 %, Apply 1 application topically 2 (two) times daily., Disp: 80 g, Rfl: 0  No Known Allergies  I personally reviewed active problem list, medication list, allergies, family history, social history with the patient/caregiver today.  ROS  Ten systems reviewed and is negative except as mentioned in HPI   Objective  Virtual encounter Vitals:   02/24/19 1218  BP: 114/67  Pulse: 85  Temp: 97.6 F (36.4 C)   Nursing Note and Vital Signs reviewed.  Physical Exam  Awake, alert and oriented, pain with external rotation of hip, also pain when she first got up from chair , she has difficulty lifting her right thigh when standing. She pointed to the area of pain and inner thigh but not the groin.    Assessment & Plan    -Red flags and when to present for emergency care or RTC including fever >101.79F, chest pain, shortness of breath, new/worsening/un-resolving symptoms,  reviewed with patient at time of visit. Follow up and care instructions discussed and provided in AVS. - I discussed the assessment and treatment plan with the patient. The patient was provided an opportunity to ask questions and all were answered. The patient agreed with the plan and demonstrated an understanding of the instructions.  I provided 15 minutes of non-face-to-face time during this encounter.  Patricia Chance, MD

## 2019-02-24 NOTE — Telephone Encounter (Signed)
Called patient she will schedule an appointment.

## 2019-02-27 ENCOUNTER — Telehealth: Payer: Self-pay

## 2019-02-27 ENCOUNTER — Emergency Department: Payer: No Typology Code available for payment source

## 2019-02-27 ENCOUNTER — Emergency Department
Admission: EM | Admit: 2019-02-27 | Discharge: 2019-02-27 | Disposition: A | Discharge status: Home / Self Care | Payer: No Typology Code available for payment source | Attending: Emergency Medicine | Admitting: Emergency Medicine

## 2019-02-27 ENCOUNTER — Other Ambulatory Visit: Payer: Self-pay | Admitting: Family Medicine

## 2019-02-27 ENCOUNTER — Encounter: Payer: Self-pay | Admitting: Emergency Medicine

## 2019-02-27 ENCOUNTER — Other Ambulatory Visit: Payer: Self-pay

## 2019-02-27 DIAGNOSIS — M1712 Unilateral primary osteoarthritis, left knee: Secondary | ICD-10-CM

## 2019-02-27 DIAGNOSIS — E119 Type 2 diabetes mellitus without complications: Secondary | ICD-10-CM | POA: Diagnosis not present

## 2019-02-27 DIAGNOSIS — I10 Essential (primary) hypertension: Secondary | ICD-10-CM | POA: Insufficient documentation

## 2019-02-27 DIAGNOSIS — M1711 Unilateral primary osteoarthritis, right knee: Secondary | ICD-10-CM | POA: Insufficient documentation

## 2019-02-27 DIAGNOSIS — Z7982 Long term (current) use of aspirin: Secondary | ICD-10-CM | POA: Diagnosis not present

## 2019-02-27 DIAGNOSIS — Z79899 Other long term (current) drug therapy: Secondary | ICD-10-CM | POA: Insufficient documentation

## 2019-02-27 DIAGNOSIS — M25561 Pain in right knee: Secondary | ICD-10-CM
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
## 2019-02-27 NOTE — Telephone Encounter (Signed)
Copied from [place] [phone]. Topic: Referral - Request for Referral >> Feb 27, 2019 11:22 AM Sheran Luz wrote: Has patient seen PCP for this complaint? Yes *If NO, is insurance requiring patient see PCP for this issue before PCP can refer them? Referral for which specialty: Ortho Preferred provider/office: None specified- patient did mention that she has been seen before and would be open to seeing the last ortho she as referred to.  Reason for referral: Knee pain

## 2019-02-27 NOTE — ED Notes (Signed)
See triage note  Presents with right knee pain  States she felt and heard something pop in her knee  Having pain to thigh and posterior knee into later lower leg  Ambulates with slight limp

## 2019-02-27 NOTE — Discharge Instructions (Signed)
Continue previous medication and use knee immobilizer for the next 7 to 10 days while walking.

## 2019-02-27 NOTE — ED Triage Notes (Signed)
Pt presents to ED via POV with c/o R leg pain, and R knee pain. Pt states was going down stairs when she felt a crack in her R leg, unable to state where she felt it and she states her "leg gave out". Pt states has been having leg swelling that has already been addressed by her PCP.

## 2019-02-27 NOTE — Telephone Encounter (Signed)
Copied from [place] [phone]. Topic: Referral - Request for Referral >> Feb 27, 2019 11:22 AM Sheran Luz wrote: Has patient seen PCP for this complaint? Yes *If NO, is insurance requiring patient see PCP for this issue before PCP can refer them? Referral for which specialty: Ortho Preferred provider/office: None specified- patient did mention that she has been seen before and would be open to seeing the last ortho she as referred to.  Reason for referral: Knee pain
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
## 2019-02-27 NOTE — ED Notes (Signed)
Knee immobilizer placed on right knee with no issue.

## 2019-04-16 ENCOUNTER — Other Ambulatory Visit: Payer: Self-pay

## 2019-04-21 ENCOUNTER — Other Ambulatory Visit: Payer: Self-pay | Admitting: Family Medicine

## 2019-04-21 DIAGNOSIS — E113213 Type 2 diabetes mellitus with mild nonproliferative diabetic retinopathy with macular edema, bilateral: Secondary | ICD-10-CM

## 2019-04-21 DIAGNOSIS — J3089 Other allergic rhinitis: Secondary | ICD-10-CM

## 2019-04-21 NOTE — Telephone Encounter (Signed)
Requested Prescriptions  Pending Prescriptions Disp Refills  . XULTOPHY 100-3.6 UNIT-MG/ML SOPN [Pharmacy Med Name: Xultophy 100-3.6 UNIT-MG/ML Subcutaneous Solution Pen-injector] 18 mL 2    Sig: INJECT 40  SUBCUTANEOUSLY ONCE DAILY     There is no refill protocol information for this order

## 2019-04-30 ENCOUNTER — Other Ambulatory Visit: Payer: Self-pay | Admitting: Family Medicine

## 2019-04-30 DIAGNOSIS — I10 Essential (primary) hypertension: Secondary | ICD-10-CM

## 2019-04-30 DIAGNOSIS — E113213 Type 2 diabetes mellitus with mild nonproliferative diabetic retinopathy with macular edema, bilateral: Secondary | ICD-10-CM

## 2019-05-01 ENCOUNTER — Ambulatory Visit (INDEPENDENT_AMBULATORY_CARE_PROVIDER_SITE_OTHER): Payer: Commercial Managed Care - PPO | Admitting: Family Medicine

## 2019-05-01 ENCOUNTER — Other Ambulatory Visit (HOSPITAL_COMMUNITY)
Admission: RE | Admit: 2019-05-01 | Discharge: 2019-05-01 | Disposition: A | Discharge status: Home / Self Care | Payer: Commercial Managed Care - PPO | Source: Ambulatory Visit | Attending: Family Medicine | Admitting: Family Medicine

## 2019-05-01 ENCOUNTER — Other Ambulatory Visit: Payer: Self-pay | Admitting: Family Medicine

## 2019-05-01 ENCOUNTER — Other Ambulatory Visit: Payer: Self-pay

## 2019-05-01 ENCOUNTER — Encounter: Payer: Self-pay | Admitting: Family Medicine

## 2019-05-01 VITALS — BP 120/60 | HR 88 | Temp 96.6°F | Resp 16 | Ht 64.0 in | Wt 177.3 lb

## 2019-05-01 DIAGNOSIS — R350 Frequency of micturition: Secondary | ICD-10-CM

## 2019-05-01 DIAGNOSIS — Z794 Long term (current) use of insulin: Secondary | ICD-10-CM

## 2019-05-01 DIAGNOSIS — Z113 Encounter for screening for infections with a predominantly sexual mode of transmission: Secondary | ICD-10-CM | POA: Diagnosis not present

## 2019-05-01 DIAGNOSIS — Z0000 Encounter for general adult medical examination without abnormal findings: Secondary | ICD-10-CM | POA: Diagnosis not present

## 2019-05-01 DIAGNOSIS — M25461 Effusion, right knee: Secondary | ICD-10-CM

## 2019-05-01 DIAGNOSIS — D649 Anemia, unspecified: Secondary | ICD-10-CM | POA: Diagnosis not present

## 2019-05-01 DIAGNOSIS — E113213 Type 2 diabetes mellitus with mild nonproliferative diabetic retinopathy with macular edema, bilateral: Secondary | ICD-10-CM

## 2019-05-01 DIAGNOSIS — R232 Flushing: Secondary | ICD-10-CM

## 2019-05-01 DIAGNOSIS — E785 Hyperlipidemia, unspecified: Secondary | ICD-10-CM

## 2019-05-01 DIAGNOSIS — Z9181 History of falling: Secondary | ICD-10-CM

## 2019-05-01 DIAGNOSIS — Z1239 Encounter for other screening for malignant neoplasm of breast: Secondary | ICD-10-CM

## 2019-05-01 DIAGNOSIS — E1169 Type 2 diabetes mellitus with other specified complication: Secondary | ICD-10-CM

## 2019-05-01 DIAGNOSIS — I10 Essential (primary) hypertension: Secondary | ICD-10-CM

## 2019-05-01 LAB — POCT URINALYSIS DIPSTICK
Appearance: NORMAL
Bilirubin, UA: NEGATIVE
Blood, UA: NEGATIVE
Glucose, UA: NEGATIVE
Ketones, UA: NEGATIVE
Leukocytes, UA: NEGATIVE
Nitrite, UA: NEGATIVE
Odor: NORMAL
Protein, UA: NEGATIVE
Spec Grav, UA: 1.01 (ref 1.010–1.025)
Urobilinogen, UA: 0.2 E.U./dL
pH, UA: 6 (ref 5.0–8.0)

## 2019-05-01 MED ORDER — CLONIDINE HCL 0.1 MG PO TABS: 0.1000 mg | ORAL_TABLET | Freq: Every day | ORAL | 1 refills | Status: DC

## 2019-05-01 MED ORDER — ATORVASTATIN CALCIUM 40 MG PO TABS: 40.0000 mg | ORAL_TABLET | Freq: Every day | ORAL | 1 refills | Status: DC

## 2019-05-01 MED ORDER — LOSARTAN POTASSIUM-HCTZ 50-12.5 MG PO TABS: 1.0000 | ORAL_TABLET | Freq: Every day | ORAL | 1 refills | Status: DC
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
## 2019-05-02 LAB — URINE CULTURE
MICRO NUMBER:: 924531
SPECIMEN QUALITY:: ADEQUATE

## 2019-05-04 LAB — CBC WITH DIFFERENTIAL/PLATELET
Absolute Monocytes: 435 cells/uL (ref 200–950)
Basophils Absolute: 28 cells/uL (ref 0–200)
Basophils Relative: 0.5 %
Eosinophils Absolute: 204 cells/uL (ref 15–500)
Eosinophils Relative: 3.7 %
HCT: 35.2 % (ref 35.0–45.0)
Hemoglobin: 12 g/dL (ref 11.7–15.5)
Lymphs Abs: 1661 cells/uL (ref 850–3900)
MCH: 30.6 pg (ref 27.0–33.0)
MCHC: 34.1 g/dL (ref 32.0–36.0)
MCV: 89.8 fL (ref 80.0–100.0)
MPV: 9.9 fL (ref 7.5–12.5)
Monocytes Relative: 7.9 %
Neutro Abs: 3174 cells/uL (ref 1500–7800)
Neutrophils Relative %: 57.7 %
Platelets: 321 10*3/uL (ref 140–400)
RBC: 3.92 10*6/uL (ref 3.80–5.10)
RDW: 13 % (ref 11.0–15.0)
Total Lymphocyte: 30.2 %
WBC: 5.5 10*3/uL (ref 3.8–10.8)

## 2019-05-04 LAB — IRON,TIBC AND FERRITIN PANEL
%SAT: 14 % (calc) — ABNORMAL LOW (ref 16–45)
Ferritin: 94 ng/mL (ref 16–288)
Iron: 44 ug/dL — ABNORMAL LOW (ref 45–160)
TIBC: 304 mcg/dL (calc) (ref 250–450)

## 2019-05-04 LAB — TSH: TSH: 2 mIU/L (ref 0.40–4.50)

## 2019-05-04 LAB — HIV ANTIBODY (ROUTINE TESTING W REFLEX): HIV 1&2 Ab, 4th Generation: NONREACTIVE

## 2019-05-04 LAB — RPR: RPR Ser Ql: NONREACTIVE

## 2019-05-05 LAB — URINE CYTOLOGY ANCILLARY ONLY
Chlamydia: NEGATIVE
Chlamydia: NEGATIVE
Neisseria Gonorrhea: NEGATIVE
Neisseria Gonorrhea: NEGATIVE

## 2019-05-27 ENCOUNTER — Ambulatory Visit: Payer: Commercial Managed Care - PPO | Attending: Neurology

## 2019-05-27 DIAGNOSIS — I10 Essential (primary) hypertension: Secondary | ICD-10-CM | POA: Insufficient documentation

## 2019-05-27 DIAGNOSIS — E1169 Type 2 diabetes mellitus with other specified complication: Secondary | ICD-10-CM | POA: Diagnosis not present

## 2019-05-27 DIAGNOSIS — E669 Obesity, unspecified: Secondary | ICD-10-CM | POA: Diagnosis not present

## 2019-05-27 DIAGNOSIS — E1159 Type 2 diabetes mellitus with other circulatory complications: Secondary | ICD-10-CM | POA: Insufficient documentation

## 2019-05-27 DIAGNOSIS — R0683 Snoring: Secondary | ICD-10-CM | POA: Diagnosis not present

## 2019-05-28 ENCOUNTER — Other Ambulatory Visit: Payer: Self-pay

## 2019-06-01 ENCOUNTER — Other Ambulatory Visit: Payer: Self-pay | Admitting: Family Medicine

## 2019-06-01 DIAGNOSIS — J3089 Other allergic rhinitis: Secondary | ICD-10-CM

## 2019-06-15 ENCOUNTER — Telehealth: Payer: Self-pay | Admitting: Family Medicine

## 2019-06-15 NOTE — Telephone Encounter (Signed)
Pt called and stated she needs samples for Xultophy . She states her pharmacy is telling her she will have to pay 300 for her insulin until the first of the year. Pt is on assistance with Almyra Free. Almyra Free is going to call pt tomorrow.

## 2019-06-16 ENCOUNTER — Ambulatory Visit: Payer: Self-pay | Admitting: Pharmacist

## 2019-06-16 NOTE — Telephone Encounter (Signed)
Pt called back in to follow up on message sent. Advised pt that Almyra Free will be calling her, per message below.    Please assist pt further.

## 2019-06-17 NOTE — Chronic Care Management (AMB) (Signed)
  Care Management   Note  06/17/2019 Name: Patricia Chan MRN: [id number] DOB: 05-27-1956  63 y.o. year old female referred to Care Management by Orvis Brill for medication assistance [name]). Last office visit with Steele Sizer, MD was 05/01/19.   Was unable to reach patient via telephone today and have left HIPAA compliant voicemail asking patient to return my call. (unsuccessful outreach #1).   Follow up plan:  A HIPPA compliant phone message was left for the patient providing contact information and requesting a return call.  The care management team will reach out to the patient again over the next 5-7 days.   Ruben Reason, PharmD Clinical Pharmacist [facility] [phone]

## 2019-06-22 NOTE — Telephone Encounter (Signed)
I left her a message with my contact information, but I have not heard from her. My second outreach is scheduled for tomorrow, 11/17.   If she calls the practice instead, please feel free to give her my direct work line: [place], [place] [phone]

## 2019-06-23 ENCOUNTER — Ambulatory Visit: Payer: Self-pay | Admitting: Pharmacist

## 2019-06-23 ENCOUNTER — Encounter: Payer: Self-pay | Admitting: Pharmacist

## 2019-06-23 NOTE — Telephone Encounter (Signed)
I was able to get in touch with patient.   Thank you for message regarding mailbox. On my phone it shows I have 3 messages. I will resolve with IT.   Ruben Reason, PharmD Clinical Pharmacist [facility] [phone]

## 2019-06-23 NOTE — Telephone Encounter (Signed)
Pt calling to follow up on med request.  I gave her your number and also tried to call you. But the mailbox is full. Pt will try to call you as well.

## 2019-06-23 NOTE — Chronic Care Management (AMB) (Signed)
  Care Management   Note  06/23/2019 Name: Jaycee Luan MRN: [id number] DOB: [birth year]  Incoming call via [place]. Patient returning pharmacist's call.   Patient asks for assistance with Xultophy. Explains her insurance requires a 30% copay. Needs Xultophy samples.    Telephone follow up appointment with care management team member scheduled for: Friday November 20 to set patient up with coupon.    Ruben Reason, PharmD Clinical Pharmacist [facility] [phone]

## 2019-07-14 ENCOUNTER — Ambulatory Visit: Payer: Self-pay | Admitting: Pharmacist

## 2019-07-14 DIAGNOSIS — Z794 Long term (current) use of insulin: Secondary | ICD-10-CM

## 2019-07-14 DIAGNOSIS — E113213 Type 2 diabetes mellitus with mild nonproliferative diabetic retinopathy with macular edema, bilateral: Secondary | ICD-10-CM

## 2019-07-16 NOTE — Chronic Care Management (AMB) (Signed)
  Care Management   Note  07/16/2019 Name: Patricia Chan MRN: [id number] DOB: 1956/04/16  Follow up call to patient regarding medication assistance. HIPAA identifiers verified.   Patient unfortunately has a high deductible plan. However, she is not currently utilizing a coupon for Xultophy. Provided patient with instructions on how to activate Xultophy coupon and provide to her pharmacy- visit website [email or web address]. Patient verbalized understanding.   Follow up The care management team is available to follow up with the patient after provider conversation with the patient regarding recommendation for care management engagement and subsequent re-referral to the care management team.   Ruben Reason, PharmD Clinical Pharmacist [place] [phone]

## 2019-07-27 ENCOUNTER — Other Ambulatory Visit: Payer: Self-pay | Admitting: Family Medicine

## 2019-07-27 DIAGNOSIS — J3089 Other allergic rhinitis: Secondary | ICD-10-CM

## 2019-07-29 ENCOUNTER — Ambulatory Visit: Payer: Commercial Managed Care - PPO | Admitting: Family Medicine

## 2019-07-29 ENCOUNTER — Encounter: Payer: Self-pay | Admitting: Family Medicine

## 2019-07-29 ENCOUNTER — Other Ambulatory Visit: Payer: Self-pay

## 2019-07-29 VITALS — BP 138/70 | HR 96 | Temp 97.3°F | Resp 16 | Ht 64.0 in | Wt 179.2 lb

## 2019-07-29 DIAGNOSIS — E669 Obesity, unspecified: Secondary | ICD-10-CM

## 2019-07-29 DIAGNOSIS — I10 Essential (primary) hypertension: Secondary | ICD-10-CM

## 2019-07-29 DIAGNOSIS — R232 Flushing: Secondary | ICD-10-CM | POA: Diagnosis not present

## 2019-07-29 DIAGNOSIS — E1159 Type 2 diabetes mellitus with other circulatory complications: Secondary | ICD-10-CM

## 2019-07-29 DIAGNOSIS — E113213 Type 2 diabetes mellitus with mild nonproliferative diabetic retinopathy with macular edema, bilateral: Secondary | ICD-10-CM | POA: Diagnosis not present

## 2019-07-29 DIAGNOSIS — E1169 Type 2 diabetes mellitus with other specified complication: Secondary | ICD-10-CM | POA: Diagnosis not present

## 2019-07-29 DIAGNOSIS — Z794 Long term (current) use of insulin: Secondary | ICD-10-CM

## 2019-07-29 DIAGNOSIS — E785 Hyperlipidemia, unspecified: Secondary | ICD-10-CM

## 2019-07-29 LAB — POCT GLYCOSYLATED HEMOGLOBIN (HGB A1C): Hemoglobin A1C: 6.6 % — AB (ref 4.0–5.6)

## 2019-07-29 MED ORDER — VENLAFAXINE HCL ER 37.5 MG PO CP24: 37.5000 mg | ORAL_CAPSULE | Freq: Every day | ORAL | 0 refills | Status: DC
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
## 2019-08-03 ENCOUNTER — Telehealth: Payer: Self-pay

## 2019-08-03 NOTE — Telephone Encounter (Signed)
Copied from [place] [phone]. Topic: General - Other >> Aug 03, 2019  2:45 PM Lennox Solders wrote: Reason for CRM: pt job is going to give them the covid 19 vaccine. Pt has dm and would like to know if dr Ancil Boozer would recommend her getting the vaccine next month

## 2019-08-04 NOTE — Telephone Encounter (Signed)
Patient called.  Patient aware.  

## 2019-08-06 ENCOUNTER — Ambulatory Visit
Admission: RE | Admit: 2019-08-06 | Discharge: 2019-08-06 | Disposition: A | Discharge status: Home / Self Care | Payer: Commercial Managed Care - PPO | Source: Ambulatory Visit | Attending: Family Medicine | Admitting: Family Medicine

## 2019-08-06 DIAGNOSIS — Z1239 Encounter for other screening for malignant neoplasm of breast: Secondary | ICD-10-CM

## 2019-08-06 DIAGNOSIS — Z1231 Encounter for screening mammogram for malignant neoplasm of breast: Secondary | ICD-10-CM | POA: Insufficient documentation

## 2019-09-09 ENCOUNTER — Other Ambulatory Visit: Payer: Self-pay | Admitting: Family Medicine

## 2019-09-09 DIAGNOSIS — E1142 Type 2 diabetes mellitus with diabetic polyneuropathy: Secondary | ICD-10-CM

## 2019-09-28 ENCOUNTER — Other Ambulatory Visit: Payer: Self-pay | Admitting: Family Medicine

## 2019-09-28 DIAGNOSIS — J3089 Other allergic rhinitis: Secondary | ICD-10-CM

## 2019-10-14 ENCOUNTER — Other Ambulatory Visit: Payer: Self-pay | Admitting: Family Medicine

## 2019-10-14 DIAGNOSIS — J3089 Other allergic rhinitis: Secondary | ICD-10-CM

## 2019-10-14 NOTE — Telephone Encounter (Signed)
Requested Prescriptions  Pending Prescriptions Disp Refills  . 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, with long-term current use of insulin [facility])   [facility], Drue Stager, MD   5 months ago Anemia, unspecified type   [facility] Steele Sizer, MD   7 months ago Acute pain of right thigh   [facility] Steele Sizer, MD   8 months ago 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

## 2019-10-19 ENCOUNTER — Other Ambulatory Visit: Payer: Self-pay | Admitting: Family Medicine

## 2019-10-19 DIAGNOSIS — E113213 Type 2 diabetes mellitus with mild nonproliferative diabetic retinopathy with macular edema, bilateral: Secondary | ICD-10-CM

## 2019-10-19 NOTE — Telephone Encounter (Signed)
Requested Prescriptions  Pending Prescriptions Disp Refills  . XULTOPHY 100-3.6 UNIT-MG/ML SOPN [Pharmacy Med Name: Xultophy 100-3.6 UNIT-MG/ML Subcutaneous Solution Pen-injector] 18 mL 0    Sig: INJECT [place]     Endocrinology:  Diabetes - Insulin + GLP-1 Receptor Agonist Combos 1 Passed - 10/19/2019  2:54 PM      Passed - HBA1C is between 0 and 7.9 and within 180 days    Hemoglobin A1C  Date Value Ref Range Status  07/29/2019 6.6 (A) 4.0 - 5.6 % Final   HbA1c, POC (controlled diabetic range)  Date Value Ref Range Status  02/11/2019 6.7 0.0 - 7.0 % Final         Passed - Valid encounter within last 6 months    Recent Outpatient Visits          2 months ago 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

## 2019-10-20 ENCOUNTER — Telehealth: Payer: Self-pay

## 2019-10-20 NOTE — Telephone Encounter (Signed)
Copied from [place] [phone]. Topic: General - Other >> Oct 20, 2019 12:16 PM Leward Quan A wrote: Reason for CRM: Patient called to ask Dr Ancil Boozer if there are any samples of XULTOPHY 100-3.6 UNIT-MG/ML SOPN because she received a message from the pharmacy stating that her medication is $158.29 and that is too expensive for her. She states that she have a savings card that was given to her and she received medication for $35. Patient is asking for a call back from nurse Ph# [phone]   Called patient to let her know that Suanne Marker is contacting Rep to see if we can get more coupons. Please call patient once we get them in.

## 2019-10-21 NOTE — Telephone Encounter (Signed)
Pt called to follow up on savings card. Requesting cb from [place]. Closed for lunch

## 2019-10-21 NOTE — Telephone Encounter (Signed)
Called pt to inform her that Aaron Edelman just came by to drop off other samples and they do not Sample Xultophy anymore and he is having a hard time downloading the vouchers to. Its a symstem issue.

## 2019-10-28 ENCOUNTER — Encounter: Payer: Self-pay | Admitting: Family Medicine

## 2019-10-28 ENCOUNTER — Other Ambulatory Visit: Payer: Self-pay

## 2019-10-28 ENCOUNTER — Ambulatory Visit: Payer: Commercial Managed Care - PPO | Admitting: Family Medicine

## 2019-10-28 VITALS — BP 128/76 | HR 98 | Temp 97.8°F | Resp 16 | Ht 64.0 in | Wt 182.3 lb

## 2019-10-28 DIAGNOSIS — E1169 Type 2 diabetes mellitus with other specified complication: Secondary | ICD-10-CM | POA: Diagnosis not present

## 2019-10-28 DIAGNOSIS — E785 Hyperlipidemia, unspecified: Secondary | ICD-10-CM | POA: Diagnosis not present

## 2019-10-28 MED ORDER — SOLIQUA 100-33 UNT-MCG/ML ~~LOC~~ SOPN: 42.0000 [IU] | PEN_INJECTOR | Freq: Every day | SUBCUTANEOUS | 2 refills | Status: DC
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
## 2019-11-02 ENCOUNTER — Other Ambulatory Visit: Payer: Self-pay | Admitting: Family Medicine

## 2019-11-02 DIAGNOSIS — E113213 Type 2 diabetes mellitus with mild nonproliferative diabetic retinopathy with macular edema, bilateral: Secondary | ICD-10-CM

## 2019-11-02 DIAGNOSIS — I10 Essential (primary) hypertension: Secondary | ICD-10-CM

## 2019-11-02 NOTE — Telephone Encounter (Signed)
Requested Prescriptions  Pending Prescriptions Disp Refills  . losartan-hydrochlorothiazide (HYZAAR) 50-12.5 MG tablet [Pharmacy Med Name: Losartan Potassium-HCTZ 50-12.5 MG Oral Tablet] 90 tablet 0    Sig: Take 1 tablet by mouth once daily     Cardiovascular: ARB + Diuretic Combos Failed - 11/02/2019 11:22 AM      Failed - K in normal range and within 180 days    Potassium  Date Value Ref Range Status  02/11/2019 3.7 3.5 - 5.3 mmol/L Final  01/15/2014 3.7 3.5 - 5.1 mmol/L Final         Failed - Na in normal range and within 180 days    Sodium  Date Value Ref Range Status  02/11/2019 141 135 - 146 mmol/L Final  01/15/2014 141 136 - 145 mmol/L Final         Failed - Cr in normal range and within 180 days    Creat  Date Value Ref Range Status  02/11/2019 0.88 0.50 - 0.99 mg/dL Final    Comment:    For patients >7 years of age, the reference limit for Creatinine is approximately 13% higher for people identified as African-American. .    Creatinine, Urine  Date Value Ref Range Status  10/15/2018 14 (L) 20 - 275 mg/dL Final         Failed - Ca in normal range and within 180 days    Calcium  Date Value Ref Range Status  02/11/2019 9.7 8.6 - 10.4 mg/dL Final   Calcium, Total  Date Value Ref Range Status  01/15/2014 9.4 8.5 - 10.1 mg/dL Final         Passed - Patient is not pregnant      Passed - Last BP in normal range    BP Readings from Last 1 Encounters:  10/28/19 128/76         Passed - Valid encounter within last 6 months    Recent Outpatient Visits          5 days ago Dyslipidemia associated with type 2 diabetes mellitus [facility])   [facility] Steele Sizer, MD   3 months ago 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 Steele Sizer, MD      Future Appointments            In 3 weeks Steele Sizer, MD [facility], [facility]

## 2019-11-21 ENCOUNTER — Other Ambulatory Visit: Payer: Self-pay | Admitting: Family Medicine

## 2019-11-21 DIAGNOSIS — R232 Flushing: Secondary | ICD-10-CM

## 2019-11-21 NOTE — Telephone Encounter (Signed)
Requested Prescriptions  Pending Prescriptions Disp Refills  . venlafaxine XR (EFFEXOR-XR) 37.5 MG 24 hr capsule [Pharmacy Med Name: Venlafaxine HCl ER 37.5 MG Oral Capsule Extended Release 24 Hour] 90 capsule 0    Sig: TAKE 1 CAPSULE BY MOUTH ONCE DAILY WITH BREAKFAST     Psychiatry: Antidepressants - SNRI - desvenlafaxine & venlafaxine Passed - 11/21/2019  8:44 AM      Passed - LDL in normal range and within 360 days    Ldl Cholesterol, Calc  Date Value Ref Range Status  01/15/2014 88 0 - 100 mg/dL Final   LDL Cholesterol (Calc)  Date Value Ref Range Status  02/11/2019 70 mg/dL (calc) Final    Comment:    Reference range: <100 . Desirable range <100 mg/dL for primary prevention;   <70 mg/dL for patients with CHD or diabetic patients  with > or = 2 CHD risk factors. Marland Kitchen LDL-C is now calculated using the Martin-Hopkins  calculation, which is a validated novel method providing  better accuracy than the Friedewald equation in the  estimation of LDL-C.  Cresenciano Genre et al. Annamaria Helling. [id number]): 2061-2068  (http://education.[employer].com/faq/FAQ164)          Passed - Total Cholesterol in normal range and within 360 days    Cholesterol  Date Value Ref Range Status  02/11/2019 130 <200 mg/dL Final  01/15/2014 153 0 - 200 mg/dL Final         Passed - Triglycerides in normal range and within 360 days    Triglycerides  Date Value Ref Range Status  02/11/2019 100 <150 mg/dL Final  01/15/2014 77 0 - 200 mg/dL Final         Passed - Completed PHQ-2 or PHQ-9 in the last 360 days.      Passed - Last BP in normal range    BP Readings from Last 1 Encounters:  10/28/19 128/76         Passed - Valid encounter within last 6 months    Recent Outpatient Visits          3 weeks ago Dyslipidemia associated with type 2 diabetes mellitus [facility])   [facility] Steele Sizer, MD   3 months ago Type 2 diabetes mellitus with both eyes affected by mild nonproliferative  retinopathy and macular edema, with long-term current use of insulin [facility])   [facility] [place], Drue Stager, MD   6 months ago Anemia, unspecified type   [facility] [place], Drue Stager, MD   9 months ago Acute pain of right thigh   [facility] Steele Sizer, MD   9 months ago 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

## 2019-11-27 ENCOUNTER — Other Ambulatory Visit: Payer: Self-pay

## 2019-11-27 ENCOUNTER — Ambulatory Visit: Payer: Commercial Managed Care - PPO | Admitting: Family Medicine

## 2019-11-27 ENCOUNTER — Encounter: Payer: Self-pay | Admitting: Family Medicine

## 2019-11-27 VITALS — BP 128/72 | HR 88 | Temp 97.9°F | Resp 16 | Ht 64.0 in | Wt 183.2 lb

## 2019-11-27 DIAGNOSIS — I10 Essential (primary) hypertension: Secondary | ICD-10-CM

## 2019-11-27 DIAGNOSIS — F325 Major depressive disorder, single episode, in full remission: Secondary | ICD-10-CM

## 2019-11-27 DIAGNOSIS — E1142 Type 2 diabetes mellitus with diabetic polyneuropathy: Secondary | ICD-10-CM

## 2019-11-27 DIAGNOSIS — J3089 Other allergic rhinitis: Secondary | ICD-10-CM

## 2019-11-27 DIAGNOSIS — R232 Flushing: Secondary | ICD-10-CM | POA: Diagnosis not present

## 2019-11-27 DIAGNOSIS — E1169 Type 2 diabetes mellitus with other specified complication: Secondary | ICD-10-CM

## 2019-11-27 DIAGNOSIS — Z794 Long term (current) use of insulin: Secondary | ICD-10-CM | POA: Diagnosis not present

## 2019-11-27 DIAGNOSIS — E113213 Type 2 diabetes mellitus with mild nonproliferative diabetic retinopathy with macular edema, bilateral: Secondary | ICD-10-CM

## 2019-11-27 DIAGNOSIS — E785 Hyperlipidemia, unspecified: Secondary | ICD-10-CM

## 2019-11-27 LAB — POCT GLYCOSYLATED HEMOGLOBIN (HGB A1C): Hemoglobin A1C: 7.6 % — AB (ref 4.0–5.6)

## 2019-11-27 MED ORDER — GABAPENTIN 300 MG PO CAPS: 300.0000 mg | ORAL_CAPSULE | Freq: Every day | ORAL | 2 refills | Status: DC

## 2019-11-27 MED ORDER — CLONIDINE HCL 0.1 MG PO TABS: 0.1000 mg | ORAL_TABLET | Freq: Every day | ORAL | 2 refills | Status: DC

## 2019-11-27 MED ORDER — ATORVASTATIN CALCIUM 40 MG PO TABS: 40.0000 mg | ORAL_TABLET | Freq: Every day | ORAL | 2 refills | Status: DC

## 2019-11-27 MED ORDER — LOSARTAN POTASSIUM-HCTZ 50-12.5 MG PO TABS: 1.0000 | ORAL_TABLET | Freq: Every day | ORAL | 1 refills | Status: DC

## 2019-11-27 MED ORDER — FLUTICASONE PROPIONATE 50 MCG/ACT NA SUSP: 2.0000 | Freq: Every day | NASAL | 1 refills | Status: DC

## 2019-11-27 MED ORDER — AZELASTINE HCL 137 MCG/SPRAY NA SOLN: 2.0000 | Freq: Two times a day (BID) | NASAL | 3 refills | Status: DC

## 2019-11-27 MED ORDER — VENLAFAXINE HCL ER 75 MG PO CP24: 75.0000 mg | ORAL_CAPSULE | Freq: Every day | ORAL | 1 refills | Status: DC
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
## 2019-11-28 LAB — LIPID PANEL
Cholesterol: 123 mg/dL (ref <200)
HDL: 37 mg/dL — ABNORMAL LOW (ref >=50)
LDL Cholesterol (Calc): 60 mg/dL (calc)
Non-HDL Cholesterol (Calc): 86 mg/dL (calc) (ref <130)
Total CHOL/HDL Ratio: 3.3 (calc) (ref <5.0)
Triglycerides: 185 mg/dL — ABNORMAL HIGH (ref <150)

## 2019-11-28 LAB — CBC WITH DIFFERENTIAL/PLATELET
Absolute Monocytes: 507 cells/uL (ref 200–950)
Basophils Absolute: 40 cells/uL (ref 0–200)
Basophils Relative: 0.7 %
Eosinophils Absolute: 217 cells/uL (ref 15–500)
Eosinophils Relative: 3.8 %
HCT: 35.1 % (ref 35.0–45.0)
Hemoglobin: 11.7 g/dL (ref 11.7–15.5)
Lymphs Abs: 1465 cells/uL (ref 850–3900)
MCH: 30.8 pg (ref 27.0–33.0)
MCHC: 33.3 g/dL (ref 32.0–36.0)
MCV: 92.4 fL (ref 80.0–100.0)
MPV: 10.6 fL (ref 7.5–12.5)
Monocytes Relative: 8.9 %
Neutro Abs: 3471 cells/uL (ref 1500–7800)
Neutrophils Relative %: 60.9 %
Platelets: 281 10*3/uL (ref 140–400)
RBC: 3.8 10*6/uL (ref 3.80–5.10)
RDW: 12.7 % (ref 11.0–15.0)
Total Lymphocyte: 25.7 %
WBC: 5.7 10*3/uL (ref 3.8–10.8)

## 2019-11-28 LAB — COMPLETE METABOLIC PANEL WITH GFR
AG Ratio: 1.8 (calc) (ref 1.0–2.5)
ALT: 16 U/L (ref 6–29)
AST: 15 U/L (ref 10–35)
Albumin: 4.2 g/dL (ref 3.6–5.1)
Alkaline phosphatase (APISO): 79 U/L (ref 37–153)
BUN: 23 mg/dL (ref 7–25)
CO2: 27 mmol/L (ref 20–32)
Calcium: 9.4 mg/dL (ref 8.6–10.4)
Chloride: 101 mmol/L (ref 98–110)
Creat: 0.87 mg/dL (ref 0.50–0.99)
GFR, Est African American: 82 mL/min/{1.73_m2} (ref >=60)
GFR, Est Non African American: 70 mL/min/{1.73_m2} (ref >=60)
Globulin: 2.3 g/dL (calc) (ref 1.9–3.7)
Glucose, Bld: 207 mg/dL — ABNORMAL HIGH (ref 65–99)
Potassium: 3.7 mmol/L (ref 3.5–5.3)
Sodium: 137 mmol/L (ref 135–146)
Total Bilirubin: 0.3 mg/dL (ref 0.2–1.2)
Total Protein: 6.5 g/dL (ref 6.1–8.1)

## 2019-11-28 LAB — MICROALBUMIN / CREATININE URINE RATIO
Creatinine, Urine: 64 mg/dL (ref 20–275)
Microalb Creat Ratio: 17 mcg/mg creat (ref <30)
Microalb, Ur: 1.1 mg/dL

## 2020-02-22 ENCOUNTER — Other Ambulatory Visit: Payer: Self-pay | Admitting: Family Medicine

## 2020-02-22 DIAGNOSIS — E1169 Type 2 diabetes mellitus with other specified complication: Secondary | ICD-10-CM

## 2020-02-22 DIAGNOSIS — J3089 Other allergic rhinitis: Secondary | ICD-10-CM

## 2020-02-22 DIAGNOSIS — R232 Flushing: Secondary | ICD-10-CM

## 2020-02-22 NOTE — Telephone Encounter (Signed)
Requested Prescriptions  Pending Prescriptions Disp Refills  . 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, with long-term current use of insulin [facility])   [facility] [place], Drue Stager, MD   3 months ago Dyslipidemia associated with type 2 diabetes mellitus [facility])   [facility] Steele Sizer, MD   6 months ago 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 SOLIQUA 100-33 UNT-MCG/ML SOPN [Pharmacy Med Name: Willeen Niece 100-33 UNT-MCG/ML Subcutaneous Solution Pen-injector] 15 mL 0    Sig: INJECT 42 UNITS SUBCUTANEOUSLY ONCE DAILY     Endocrinology: Diabetes - Insulin + GLP-1 Receptor Agonist Combos 2 Passed - 02/22/2020  6:05 PM      Passed - HBA1C is between 0 and 7.9 and within 180 days    Hemoglobin A1C  Date Value Ref Range Status  11/27/2019 7.6 (A) 4.0 - 5.6 % Final   HbA1c, POC (controlled diabetic range)  Date Value Ref Range Status  02/11/2019 6.7 0.0 - 7.0 % Final         Passed - Cr in normal range and within 360 days    Creat  Date Value Ref Range Status  11/27/2019 0.87 0.50 - 0.99 mg/dL Final    Comment:     For patients >67 years of age, the reference limit for Creatinine is approximately 13% higher for people identified as African-American. .    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, with long-term current use of insulin [facility]   3 months ago Dyslipidemia associated with type 2 diabetes mellitus ([facility]   6 months ago Type 2 diabetes mellitus with both eyes affected by  mild nonproliferative retinopathy and macular edema, with long-term current use of insulin [facility])   [facility] Steele Sizer, MD   9 months ago Anemia, unspecified type   [facility] [place], Drue Stager, MD   12 months ago Acute pain of right thigh   [facility] Steele Sizer, MD      Future Appointments            In 1 month Ancil Boozer, Drue Stager, MD [facility], [place]           . venlafaxine XR (EFFEXOR-XR) 37.5 MG 24 hr capsule [Pharmacy Med Name: Venlafaxine HCl ER 37.5 MG Oral Capsule Extended Release 24 Hour] 90 capsule 0    Sig: TAKE 1 CAPSULE BY MOUTH ONCE DAILY WITH BREAKFAST     Psychiatry: Antidepressants - SNRI - desvenlafaxine & venlafaxine Failed - 02/22/2020  6:05 PM      Failed - Triglycerides in normal range and within 360 days    Triglycerides  Date Value Ref Range Status  11/27/2019 185 (H) <150 mg/dL  Final  01/15/2014 77 0 - 200 mg/dL Final         Passed - LDL in normal range and within 360 days    Ldl Cholesterol, Calc  Date Value Ref Range Status  01/15/2014 88 0 - 100 mg/dL Final   LDL Cholesterol (Calc)  Date Value Ref Range Status  11/27/2019 60 mg/dL (calc) Final    Comment:    Reference range: <100 . Desirable range <100 mg/dL for primary prevention;   <70 mg/dL for patients with CHD or diabetic patients  with > or = 2 CHD risk factors. Marland Kitchen LDL-C is now calculated using the Martin-Hopkins  calculation, which is a validated novel method providing  better accuracy than the Friedewald equation in the  estimation of LDL-C.  Cresenciano Genre et al. Annamaria Helling. 3212;248(25): 2061-2068  (http://education.[employer].com/faq/FAQ164)          Passed - Total Cholesterol in normal range and within 360 days    Cholesterol  Date Value Ref Range Status  11/27/2019 123 <200 mg/dL Final  01/15/2014 153 0 - 200 mg/dL Final         Passed - Completed PHQ-2 or PHQ-9 in the last 360 days.      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 atorvastatin (LIPITOR) 40 MG tablet [Pharmacy Med Name: Atorvastatin Calcium 40 MG Oral Tablet] 90 tablet 0    Sig: Take 1 tablet by mouth once daily     Cardiovascular:  Antilipid - Statins Failed - 02/22/2020  6:05 PM      Failed - HDL in normal range and within 360 days    HDL Cholesterol  Date Value Ref Range Status  01/15/2014 50 40 - 60 mg/dL Final   HDL  Date Value Ref Range Status  11/27/2019 37 (L) > OR = 50 mg/dL Final         Failed - Triglycerides in normal range and within 360 days    Triglycerides  Date Value Ref Range Status  11/27/2019 185 (H) <150 mg/dL Final  01/15/2014 77 0 - 200 mg/dL Final         Passed - Total Cholesterol in normal range and within 360 days    Cholesterol  Date Value Ref Range Status  11/27/2019 123 <200 mg/dL Final  01/15/2014 153 0 - 200 mg/dL Final         Passed - LDL in normal range and within 360 days    Ldl Cholesterol, Calc  Date Value Ref Range Status  01/15/2014 88 0 - 100 mg/dL Final   LDL Cholesterol (Calc)  Date Value Ref Range Status  11/27/2019 60 mg/dL (calc) Final    Comment:    Reference range: <100 . Desirable range <100 mg/dL for primary prevention;   <70 mg/dL for patients with CHD or diabetic patients  with > or = 2 CHD risk factors. Marland Kitchen LDL-C is now calculated using the Martin-Hopkins  calculation, which is a validated novel method providing  better accuracy than the Friedewald equation in the  estimation of LDL-C.  Cresenciano Genre et al. Annamaria Helling. 6010;932(35): 2061-2068  (http://education.[employer].com/faq/FAQ164)          Passed - Patient is not pregnant      Passed - Valid encounter within last 12 months    Recent Outpatient Visits          2 months ago Type 2 diabetes mellitus with both eyes affected by mild nonproliferative retinopathy  and macular edema, with long-term current use of insulin [facility])   [facility] [place], Drue Stager, MD   3 months ago Dyslipidemia associated with type 2 diabetes mellitus [facility])   [facility] Steele Sizer, MD   6 months ago 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

## 2020-02-27 ENCOUNTER — Other Ambulatory Visit: Payer: Self-pay | Admitting: Family Medicine

## 2020-02-27 DIAGNOSIS — E1169 Type 2 diabetes mellitus with other specified complication: Secondary | ICD-10-CM

## 2020-02-27 DIAGNOSIS — R232 Flushing: Secondary | ICD-10-CM

## 2020-02-27 DIAGNOSIS — J3089 Other allergic rhinitis: Secondary | ICD-10-CM

## 2020-03-02 ENCOUNTER — Other Ambulatory Visit: Payer: Self-pay | Admitting: Family Medicine

## 2020-03-02 DIAGNOSIS — J3089 Other allergic rhinitis: Secondary | ICD-10-CM

## 2020-03-02 NOTE — Telephone Encounter (Signed)
Patient has requested Rx 3 times- refused twice for completion of course. Sent for review of request- patient has upcoming appointment 03/29/20.

## 2020-03-08 ENCOUNTER — Telehealth: Payer: Self-pay

## 2020-03-08 NOTE — Telephone Encounter (Signed)
Copied from [place] [phone]. Topic: General - Other >> Mar 07, 2020  1:51 PM [name] wrote: Patient is calling to request her insulin be changed because her insurance does not cover the current insulin and it would be too expensive.  Patient said she has not gotten a call back.   Please advise and call patient to discuss.

## 2020-03-08 NOTE — Telephone Encounter (Signed)
Pt called back.  I provided her with the 3 insulins Dr Ancil Boozer mentioned in her note. She will cb with the one Medicare will cover

## 2020-03-08 NOTE — Telephone Encounter (Signed)
Called patient she will contact Medicare to see what other insulin is covered.

## 2020-03-11 NOTE — Telephone Encounter (Signed)
Spoke patient in regards to scheduling her an appointment to see Dr. Ancil Boozer on 03/14/20 at 1 pm but patient that she already has an appointment with Dr. Ancil Boozer on 03/29/20 @ 3:20pm and would prefer to come then.  Patient stated that her insurance will cover the Tresiba at cheaper price $35 copay, therefore patient would like that insulin to be sent to [facility] on [address].  Patient stated that she going to need a refill by 03/20/20.

## 2020-03-12 ENCOUNTER — Other Ambulatory Visit: Payer: Self-pay | Admitting: Family Medicine

## 2020-03-12 MED ORDER — INSULIN DEGLUDEC 100 UNIT/ML ~~LOC~~ SOPN: 42.0000 [IU] | PEN_INJECTOR | Freq: Every day | SUBCUTANEOUS | 0 refills | Status: DC

## 2020-03-17 ENCOUNTER — Telehealth: Payer: Self-pay | Admitting: Family Medicine

## 2020-03-17 NOTE — Telephone Encounter (Signed)
The plan stated they dont handle PA for this pt and that the PA needs to go through RX benefits [phone] please contact this number or fax to [phone]  This is for the insulin degludec (TRESIBA) 100 UNIT/ML FlexTouch Pen

## 2020-03-18 NOTE — Telephone Encounter (Signed)
Please disregard the above message. I contacted patient and she said she no longer has this insurance. Insurance information was not UTD to process.

## 2020-03-18 NOTE — Telephone Encounter (Signed)
I tried to submit the PA on the [email or web address] website and the message that I got was as follows.  Our records indicates that this patient's eligibility is termed/future dated. PA request cannot be made for a patient with Termerd/Future eligibility.  If you consider this an error please contact the prior authorization department of the plan sponsor at customer service at [phone].

## 2020-03-28 NOTE — Patient Instructions (Addendum)
Bacterial Vaginosis  Bacterial vaginosis is a vaginal infection that occurs when the normal balance of bacteria in the vagina is disrupted. It results from an overgrowth of certain bacteria. This is the most common vaginal infection among women ages 15-44. Because bacterial vaginosis increases your risk for STIs (sexually transmitted infections), getting treated can help reduce your risk for chlamydia, gonorrhea, herpes, and HIV (human immunodeficiency virus). Treatment is also important for preventing complications in pregnant women, because this condition can cause an early (premature) delivery. What are the causes? This condition is caused by an increase in harmful bacteria that are normally present in small amounts in the vagina. However, the reason that the condition develops is not fully understood. What increases the risk? The following factors may make you more likely to develop this condition:  Having a new sexual partner or multiple sexual partners.  Having unprotected sex.  Douching.  Having an intrauterine device (IUD).  Smoking.  Drug and alcohol abuse.  Taking certain antibiotic medicines.  Being pregnant. You cannot get bacterial vaginosis from toilet seats, bedding, swimming pools, or contact with objects around you. What are the signs or symptoms? Symptoms of this condition include:  Grey or white vaginal discharge. The discharge can also be watery or foamy.  A fish-like odor with discharge, especially after sexual intercourse or during menstruation.  Itching in and around the vagina.  Burning or pain with urination. Some women with bacterial vaginosis have no signs or symptoms. How is this diagnosed? This condition is diagnosed based on:  Your medical history.  A physical exam of the vagina.  Testing a sample of vaginal fluid under a microscope to look for a large amount of bad bacteria or abnormal cells. Your health care provider may use a cotton swab or  a small wooden spatula to collect the sample. How is this treated? This condition is treated with antibiotics. These may be given as a pill, a vaginal cream, or a medicine that is put into the vagina (suppository). If the condition comes back after treatment, a second round of antibiotics may be needed. Follow these instructions at home: Medicines  Take over-the-counter and prescription medicines only as told by your health care provider.  Take or use your antibiotic as told by your health care provider. Do not stop taking or using the antibiotic even if you start to feel better. General instructions  If you have a female sexual partner, tell her that you have a vaginal infection. She should see her health care provider and be treated if she has symptoms. If you have a female sexual partner, he does not need treatment.  During treatment: ? Avoid sexual activity until you finish treatment. ? Do not douche. ? Avoid alcohol as directed by your health care provider. ? Avoid breastfeeding as directed by your health care provider.  Drink enough water and fluids to keep your urine clear or pale yellow.  Keep the area around your vagina and rectum clean. ? Wash the area daily with warm water. ? Wipe yourself from front to back after using the toilet.  Keep all follow-up visits as told by your health care provider. This is important. How is this prevented?  Do not douche.  Wash the outside of your vagina with warm water only.  Use protection when having sex. This includes latex condoms and dental dams.  Limit how many sexual partners you have. To help prevent bacterial vaginosis, it is best to have sex with just one partner (  monogamous).  Make sure you and your sexual partner are tested for STIs.  Wear cotton or cotton-lined underwear.  Avoid wearing tight pants and pantyhose, especially during summer.  Limit the amount of alcohol that you drink.  Do not use any products that contain  nicotine or tobacco, such as cigarettes and e-cigarettes. If you need help quitting, ask your health care provider.  Do not use illegal drugs. Where to find more information  Centers for Disease Control and Prevention: www.cdc.gov/std  American Sexual Health Association (ASHA): www.ashastd.org  U.S. Department of Health and Human Services, Office on Women's Health: www.womenshealth.gov/ or https://www.womenshealth.gov/a-z-topics/bacterial-vaginosis Contact a health care provider if:  Your symptoms do not improve, even after treatment.  You have more discharge or pain when urinating.  You have a fever.  You have pain in your abdomen.  You have pain during sex.  You have vaginal bleeding between periods. Summary  Bacterial vaginosis is a vaginal infection that occurs when the normal balance of bacteria in the vagina is disrupted.  Because bacterial vaginosis increases your risk for STIs (sexually transmitted infections), getting treated can help reduce your risk for chlamydia, gonorrhea, herpes, and HIV (human immunodeficiency virus). Treatment is also important for preventing complications in pregnant women, because the condition can cause an early (premature) delivery.  This condition is treated with antibiotic medicines. These may be given as a pill, a vaginal cream, or a medicine that is put into the vagina (suppository). This information is not intended to replace advice given to you by your health care provider. Make sure you discuss any questions you have with your health care provider. Document Revised: 07/05/2017 Document Reviewed: 04/07/2016 Elsevier Patient Education  2020 [employer]  

## 2020-03-29 ENCOUNTER — Encounter: Payer: Self-pay | Admitting: Family Medicine

## 2020-03-29 ENCOUNTER — Other Ambulatory Visit: Payer: Self-pay

## 2020-03-29 ENCOUNTER — Other Ambulatory Visit (HOSPITAL_COMMUNITY)
Admission: RE | Admit: 2020-03-29 | Discharge: 2020-03-29 | Disposition: A | Discharge status: Home / Self Care | Payer: Medicare Other | Source: Ambulatory Visit | Attending: Family Medicine | Admitting: Family Medicine

## 2020-03-29 ENCOUNTER — Ambulatory Visit (INDEPENDENT_AMBULATORY_CARE_PROVIDER_SITE_OTHER): Payer: Medicare Other | Admitting: Family Medicine

## 2020-03-29 VITALS — BP 150/70 | HR 93 | Temp 97.7°F | Resp 16 | Ht 64.0 in | Wt 181.8 lb

## 2020-03-29 DIAGNOSIS — Z23 Encounter for immunization: Secondary | ICD-10-CM

## 2020-03-29 DIAGNOSIS — E1159 Type 2 diabetes mellitus with other circulatory complications: Secondary | ICD-10-CM

## 2020-03-29 DIAGNOSIS — E669 Obesity, unspecified: Secondary | ICD-10-CM

## 2020-03-29 DIAGNOSIS — S61012A Laceration without foreign body of left thumb without damage to nail, initial encounter: Secondary | ICD-10-CM | POA: Diagnosis not present

## 2020-03-29 DIAGNOSIS — N898 Other specified noninflammatory disorders of vagina: Secondary | ICD-10-CM | POA: Insufficient documentation

## 2020-03-29 DIAGNOSIS — Z794 Long term (current) use of insulin: Secondary | ICD-10-CM | POA: Diagnosis not present

## 2020-03-29 DIAGNOSIS — E113213 Type 2 diabetes mellitus with mild nonproliferative diabetic retinopathy with macular edema, bilateral: Secondary | ICD-10-CM | POA: Diagnosis not present

## 2020-03-29 DIAGNOSIS — E785 Hyperlipidemia, unspecified: Secondary | ICD-10-CM

## 2020-03-29 DIAGNOSIS — I10 Essential (primary) hypertension: Secondary | ICD-10-CM

## 2020-03-29 DIAGNOSIS — E1169 Type 2 diabetes mellitus with other specified complication: Secondary | ICD-10-CM | POA: Diagnosis not present

## 2020-03-29 LAB — POCT GLYCOSYLATED HEMOGLOBIN (HGB A1C): Hemoglobin A1C: 8 % — AB (ref 4.0–5.6)

## 2020-03-29 MED ORDER — METFORMIN HCL ER 500 MG PO TB24: 500.0000 mg | ORAL_TABLET | Freq: Every day | ORAL | 0 refills | Status: DC

## 2020-03-29 MED ORDER — METRONIDAZOLE 500 MG PO TABS: 500.0000 mg | ORAL_TABLET | Freq: Two times a day (BID) | ORAL | 0 refills | Status: DC
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
## 2020-03-31 LAB — CERVICOVAGINAL ANCILLARY ONLY
Bacterial Vaginitis (gardnerella): NEGATIVE
Candida Glabrata: NEGATIVE
Candida Vaginitis: NEGATIVE
Chlamydia: NEGATIVE
Comment: NEGATIVE
Comment: NEGATIVE
Comment: NEGATIVE
Comment: NEGATIVE
Comment: NEGATIVE
Comment: NORMAL
Neisseria Gonorrhea: NEGATIVE
Trichomonas: NEGATIVE

## 2020-04-15 ENCOUNTER — Other Ambulatory Visit: Payer: Self-pay | Admitting: Family Medicine

## 2020-04-15 DIAGNOSIS — Z1231 Encounter for screening mammogram for malignant neoplasm of breast: Secondary | ICD-10-CM

## 2020-05-03 ENCOUNTER — Other Ambulatory Visit: Payer: Self-pay | Admitting: Family Medicine

## 2020-05-17 LAB — HM DIABETES EYE EXAM

## 2020-06-21 ENCOUNTER — Other Ambulatory Visit: Payer: Self-pay | Admitting: Family Medicine

## 2020-06-24 LAB — HM DIABETES EYE EXAM

## 2020-06-29 ENCOUNTER — Encounter: Payer: Self-pay | Admitting: Family Medicine

## 2020-07-05 ENCOUNTER — Other Ambulatory Visit: Payer: Self-pay | Admitting: Family Medicine

## 2020-07-05 DIAGNOSIS — Z794 Long term (current) use of insulin: Secondary | ICD-10-CM

## 2020-07-05 DIAGNOSIS — R232 Flushing: Secondary | ICD-10-CM

## 2020-07-05 DIAGNOSIS — E113213 Type 2 diabetes mellitus with mild nonproliferative diabetic retinopathy with macular edema, bilateral: Secondary | ICD-10-CM

## 2020-07-05 DIAGNOSIS — I10 Essential (primary) hypertension: Secondary | ICD-10-CM

## 2020-08-08 ENCOUNTER — Ambulatory Visit
Admission: RE | Admit: 2020-08-08 | Discharge: 2020-08-08 | Disposition: A | Discharge status: Home / Self Care | Payer: Medicare Other | Source: Ambulatory Visit | Attending: Family Medicine | Admitting: Family Medicine

## 2020-08-08 ENCOUNTER — Other Ambulatory Visit: Payer: Self-pay

## 2020-08-08 DIAGNOSIS — Z1231 Encounter for screening mammogram for malignant neoplasm of breast: Secondary | ICD-10-CM | POA: Insufficient documentation

## 2020-08-09 ENCOUNTER — Ambulatory Visit: Payer: Medicare Other | Admitting: Family Medicine

## 2020-08-15 ENCOUNTER — Encounter: Payer: Self-pay | Admitting: Family Medicine

## 2020-08-15 ENCOUNTER — Other Ambulatory Visit: Payer: Self-pay

## 2020-08-15 ENCOUNTER — Ambulatory Visit (INDEPENDENT_AMBULATORY_CARE_PROVIDER_SITE_OTHER): Payer: Medicare Other | Admitting: Family Medicine

## 2020-08-15 VITALS — BP 134/60 | HR 87 | Temp 98.2°F | Resp 16 | Ht 64.0 in | Wt 179.8 lb

## 2020-08-15 DIAGNOSIS — Z794 Long term (current) use of insulin: Secondary | ICD-10-CM

## 2020-08-15 DIAGNOSIS — E785 Hyperlipidemia, unspecified: Secondary | ICD-10-CM | POA: Insufficient documentation

## 2020-08-15 DIAGNOSIS — E1159 Type 2 diabetes mellitus with other circulatory complications: Secondary | ICD-10-CM | POA: Insufficient documentation

## 2020-08-15 DIAGNOSIS — R232 Flushing: Secondary | ICD-10-CM

## 2020-08-15 DIAGNOSIS — I10 Essential (primary) hypertension: Secondary | ICD-10-CM | POA: Diagnosis not present

## 2020-08-15 DIAGNOSIS — E113213 Type 2 diabetes mellitus with mild nonproliferative diabetic retinopathy with macular edema, bilateral: Secondary | ICD-10-CM

## 2020-08-15 DIAGNOSIS — E559 Vitamin D deficiency, unspecified: Secondary | ICD-10-CM | POA: Insufficient documentation

## 2020-08-15 DIAGNOSIS — E1169 Type 2 diabetes mellitus with other specified complication: Secondary | ICD-10-CM | POA: Diagnosis not present

## 2020-08-15 DIAGNOSIS — E538 Deficiency of other specified B group vitamins: Secondary | ICD-10-CM | POA: Insufficient documentation

## 2020-08-15 DIAGNOSIS — J3089 Other allergic rhinitis: Secondary | ICD-10-CM | POA: Diagnosis not present

## 2020-08-15 DIAGNOSIS — I152 Hypertension secondary to endocrine disorders: Secondary | ICD-10-CM

## 2020-08-15 DIAGNOSIS — Z974 Presence of external hearing-aid: Secondary | ICD-10-CM | POA: Insufficient documentation

## 2020-08-15 LAB — POCT GLYCOSYLATED HEMOGLOBIN (HGB A1C): Hemoglobin A1C: 8 % — AB (ref 4.0–5.6)

## 2020-08-15 MED ORDER — LOSARTAN POTASSIUM-HCTZ 50-12.5 MG PO TABS: 1.0000 | ORAL_TABLET | Freq: Every day | ORAL | 1 refills | Status: DC

## 2020-08-15 MED ORDER — VENLAFAXINE HCL ER 37.5 MG PO CP24: 37.5000 mg | ORAL_CAPSULE | Freq: Every day | ORAL | 0 refills | Status: DC

## 2020-08-15 MED ORDER — METFORMIN HCL ER 750 MG PO TB24: 1500.0000 mg | ORAL_TABLET | Freq: Every day | ORAL | 1 refills | Status: DC
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
## 2020-08-15 NOTE — Patient Instructions (Addendum)
Hydrocortisone 1 % cream  Whips crackers at [employer]

## 2020-08-21 ENCOUNTER — Other Ambulatory Visit: Payer: Self-pay | Admitting: Family Medicine

## 2020-08-29 ENCOUNTER — Other Ambulatory Visit: Payer: Self-pay | Admitting: Family Medicine

## 2020-08-29 DIAGNOSIS — J3089 Other allergic rhinitis: Secondary | ICD-10-CM

## 2020-09-09 ENCOUNTER — Other Ambulatory Visit: Payer: Self-pay | Admitting: Family Medicine

## 2020-09-09 DIAGNOSIS — R232 Flushing: Secondary | ICD-10-CM

## 2020-09-12 ENCOUNTER — Other Ambulatory Visit: Payer: Self-pay | Admitting: Family Medicine

## 2020-09-12 DIAGNOSIS — R232 Flushing: Secondary | ICD-10-CM

## 2020-11-10 ENCOUNTER — Telehealth: Payer: Self-pay

## 2020-11-10 NOTE — Telephone Encounter (Signed)
Copied from [place] [id number]. Topic: General - Other >> Nov 10, 2020  2:28 PM [name] wrote: Reason for CRM: Patient is requesting a note to be excused from jury duty on 12/22/20.  Patient stated with her neuropathy she cannot sit that long and would need to be excused.  Please advise and let patient know when she can pick up the letter.  CB# [phone]

## 2020-11-11 ENCOUNTER — Encounter: Payer: Self-pay | Admitting: Family Medicine

## 2020-12-07 ENCOUNTER — Other Ambulatory Visit: Payer: Self-pay | Admitting: Family Medicine

## 2020-12-07 NOTE — Telephone Encounter (Signed)
Requested Prescriptions  Pending Prescriptions Disp Refills  . 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, with long-term current use of insulin [facility])   [facility] Steele Sizer, MD   8 months ago 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, with long-term current use of insulin [facility])   [facility] [place], Drue Stager, MD   1 year ago 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, with long-term current use of insulin [facility])   [facility] [place], Drue Stager, MD   1 year ago Dyslipidemia associated with type 2 diabetes mellitus [facility])   [facility] Steele Sizer, MD   1 year ago 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

## 2020-12-12 ENCOUNTER — Other Ambulatory Visit: Payer: Self-pay | Admitting: Family Medicine

## 2020-12-12 DIAGNOSIS — E1169 Type 2 diabetes mellitus with other specified complication: Secondary | ICD-10-CM

## 2020-12-12 DIAGNOSIS — E785 Hyperlipidemia, unspecified: Secondary | ICD-10-CM

## 2020-12-12 NOTE — Telephone Encounter (Signed)
Requested Prescriptions  Pending Prescriptions Disp Refills  . atorvastatin (LIPITOR) 40 MG tablet [Pharmacy Med Name: ATORVASTATIN 40 MG TABLET] 30 tablet 0    Sig: TAKE 1 TABLET BY MOUTH ONCE A DAY     Cardiovascular:  Antilipid - Statins Failed - 12/12/2020  9:37 AM      Failed - Total Cholesterol in normal range and within 360 days    Cholesterol  Date Value Ref Range Status  11/27/2019 123 <200 mg/dL Final  01/15/2014 153 0 - 200 mg/dL Final         Failed - LDL in normal range and within 360 days    Ldl Cholesterol, Calc  Date Value Ref Range Status  01/15/2014 88 0 - 100 mg/dL Final   LDL Cholesterol (Calc)  Date Value Ref Range Status  11/27/2019 60 mg/dL (calc) Final    Comment:    Reference range: <100 . Desirable range <100 mg/dL for primary prevention;   <70 mg/dL for patients with CHD or diabetic patients  with > or = 2 CHD risk factors. Marland Kitchen LDL-C is now calculated using the Martin-Hopkins  calculation, which is a validated novel method providing  better accuracy than the Friedewald equation in the  estimation of LDL-C.  Cresenciano Genre et al. Annamaria Helling. 1324;401(02): 2061-2068  (http://education.[facility].com/faq/FAQ164)          Failed - HDL in normal range and within 360 days    HDL Cholesterol  Date Value Ref Range Status  01/15/2014 50 40 - 60 mg/dL Final   HDL  Date Value Ref Range Status  11/27/2019 37 (L) > OR = 50 mg/dL Final         Failed - Triglycerides in normal range and within 360 days    Triglycerides  Date Value Ref Range Status  11/27/2019 185 (H) <150 mg/dL Final  01/15/2014 77 0 - 200 mg/dL Final         Passed - Patient is not pregnant      Passed - Valid encounter within last 12 months    Recent Outpatient Visits          3 months ago 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, with long-term current use of insulin [facility])   [facility] [place],  Drue Stager, MD   8 months ago 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, with long-term current use of insulin [facility])   [facility] [place], Drue Stager, MD   1 year ago 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, with long-term current use of insulin [facility])   [facility] [place], Drue Stager, MD   1 year ago Dyslipidemia associated with type 2 diabetes mellitus [facility])   [facility] Steele Sizer, MD   1 year ago 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

## 2020-12-23 ENCOUNTER — Other Ambulatory Visit: Payer: Self-pay

## 2020-12-23 ENCOUNTER — Encounter: Payer: Self-pay | Admitting: Family Medicine

## 2020-12-23 ENCOUNTER — Ambulatory Visit (INDEPENDENT_AMBULATORY_CARE_PROVIDER_SITE_OTHER): Payer: Medicare Other | Admitting: Family Medicine

## 2020-12-23 VITALS — BP 120/72 | HR 86 | Temp 98.3°F | Resp 16 | Ht 64.0 in | Wt 177.0 lb

## 2020-12-23 DIAGNOSIS — E1169 Type 2 diabetes mellitus with other specified complication: Secondary | ICD-10-CM

## 2020-12-23 DIAGNOSIS — F325 Major depressive disorder, single episode, in full remission: Secondary | ICD-10-CM

## 2020-12-23 DIAGNOSIS — E785 Hyperlipidemia, unspecified: Secondary | ICD-10-CM | POA: Diagnosis not present

## 2020-12-23 DIAGNOSIS — E113213 Type 2 diabetes mellitus with mild nonproliferative diabetic retinopathy with macular edema, bilateral: Secondary | ICD-10-CM

## 2020-12-23 DIAGNOSIS — D649 Anemia, unspecified: Secondary | ICD-10-CM

## 2020-12-23 DIAGNOSIS — E559 Vitamin D deficiency, unspecified: Secondary | ICD-10-CM

## 2020-12-23 DIAGNOSIS — Z23 Encounter for immunization: Secondary | ICD-10-CM

## 2020-12-23 DIAGNOSIS — E538 Deficiency of other specified B group vitamins: Secondary | ICD-10-CM | POA: Diagnosis not present

## 2020-12-23 DIAGNOSIS — Z794 Long term (current) use of insulin: Secondary | ICD-10-CM

## 2020-12-23 DIAGNOSIS — R232 Flushing: Secondary | ICD-10-CM

## 2020-12-23 DIAGNOSIS — I10 Essential (primary) hypertension: Secondary | ICD-10-CM

## 2020-12-23 LAB — POCT GLYCOSYLATED HEMOGLOBIN (HGB A1C): Hemoglobin A1C: 8 % — AB (ref 4.0–5.6)

## 2020-12-23 MED ORDER — PIOGLITAZONE HCL 15 MG PO TABS: 15.0000 mg | ORAL_TABLET | Freq: Every day | ORAL | 1 refills | Status: DC

## 2020-12-23 MED ORDER — METFORMIN HCL ER 750 MG PO TB24: 1500.0000 mg | ORAL_TABLET | Freq: Every day | ORAL | 1 refills | Status: DC

## 2020-12-23 MED ORDER — TRESIBA FLEXTOUCH 100 UNIT/ML ~~LOC~~ SOPN
46.0000 [IU] | PEN_INJECTOR | Freq: Every day | SUBCUTANEOUS | 2 refills | Status: DC
Start: 2020-12-23 — End: 2021-05-16

## 2020-12-23 MED ORDER — VENLAFAXINE HCL ER 75 MG PO CP24: 75.0000 mg | ORAL_CAPSULE | Freq: Every day | ORAL | 1 refills | Status: DC

## 2020-12-23 MED ORDER — LOSARTAN POTASSIUM-HCTZ 50-12.5 MG PO TABS: 1.0000 | ORAL_TABLET | Freq: Every day | ORAL | 1 refills | Status: DC

## 2020-12-23 MED ORDER — SHINGRIX 50 MCG/0.5ML IM SUSR: 0.5000 mL | Freq: Once | INTRAMUSCULAR | 1 refills | Status: AC
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
## 2020-12-24 LAB — CBC WITH DIFFERENTIAL/PLATELET
Absolute Monocytes: 421 cells/uL (ref 200–950)
Basophils Absolute: 31 cells/uL (ref 0–200)
Basophils Relative: 0.5 %
Eosinophils Absolute: 183 cells/uL (ref 15–500)
Eosinophils Relative: 3 %
HCT: 35.5 % (ref 35.0–45.0)
Hemoglobin: 11.7 g/dL (ref 11.7–15.5)
Lymphs Abs: 1623 cells/uL (ref 850–3900)
MCH: 29.4 pg (ref 27.0–33.0)
MCHC: 33 g/dL (ref 32.0–36.0)
MCV: 89.2 fL (ref 80.0–100.0)
MPV: 10.3 fL (ref 7.5–12.5)
Monocytes Relative: 6.9 %
Neutro Abs: 3843 cells/uL (ref 1500–7800)
Neutrophils Relative %: 63 %
Platelets: 282 10*3/uL (ref 140–400)
RBC: 3.98 10*6/uL (ref 3.80–5.10)
RDW: 12.5 % (ref 11.0–15.0)
Total Lymphocyte: 26.6 %
WBC: 6.1 10*3/uL (ref 3.8–10.8)

## 2020-12-24 LAB — MICROALBUMIN / CREATININE URINE RATIO
Creatinine, Urine: 154 mg/dL (ref 20–275)
Microalb Creat Ratio: 5 mcg/mg creat (ref <30)
Microalb, Ur: 0.8 mg/dL

## 2020-12-24 LAB — LIPID PANEL
Cholesterol: 136 mg/dL (ref <200)
HDL: 41 mg/dL — ABNORMAL LOW (ref >=50)
LDL Cholesterol (Calc): 75 mg/dL (calc)
Non-HDL Cholesterol (Calc): 95 mg/dL (calc) (ref <130)
Total CHOL/HDL Ratio: 3.3 (calc) (ref <5.0)
Triglycerides: 115 mg/dL (ref <150)

## 2020-12-24 LAB — COMPLETE METABOLIC PANEL WITH GFR
AG Ratio: 1.9 (calc) (ref 1.0–2.5)
ALT: 12 U/L (ref 6–29)
AST: 11 U/L (ref 10–35)
Albumin: 4.3 g/dL (ref 3.6–5.1)
Alkaline phosphatase (APISO): 72 U/L (ref 37–153)
BUN: 24 mg/dL (ref 7–25)
CO2: 29 mmol/L (ref 20–32)
Calcium: 9.4 mg/dL (ref 8.6–10.4)
Chloride: 103 mmol/L (ref 98–110)
Creat: 0.76 mg/dL (ref 0.50–0.99)
GFR, Est African American: 95 mL/min/{1.73_m2} (ref >=60)
GFR, Est Non African American: 82 mL/min/{1.73_m2} (ref >=60)
Globulin: 2.3 g/dL (calc) (ref 1.9–3.7)
Glucose, Bld: 83 mg/dL (ref 65–99)
Potassium: 3.5 mmol/L (ref 3.5–5.3)
Sodium: 141 mmol/L (ref 135–146)
Total Bilirubin: 0.3 mg/dL (ref 0.2–1.2)
Total Protein: 6.6 g/dL (ref 6.1–8.1)

## 2020-12-24 LAB — VITAMIN B12: Vitamin B-12: 981 pg/mL (ref 200–1100)

## 2020-12-30 ENCOUNTER — Other Ambulatory Visit: Payer: Self-pay | Admitting: Family Medicine

## 2020-12-30 DIAGNOSIS — J3089 Other allergic rhinitis: Secondary | ICD-10-CM

## 2020-12-30 NOTE — Telephone Encounter (Signed)
Requested Prescriptions  Pending Prescriptions Disp Refills  . 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, with long-term current use of insulin [facility])   [facility] Steele Sizer, MD   9 months ago 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, with long-term current use of insulin [facility])   [facility] [place], Drue Stager, MD   1 year ago 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

## 2021-01-04 ENCOUNTER — Other Ambulatory Visit: Payer: Self-pay | Admitting: Family Medicine

## 2021-01-04 DIAGNOSIS — E1169 Type 2 diabetes mellitus with other specified complication: Secondary | ICD-10-CM

## 2021-01-30 ENCOUNTER — Other Ambulatory Visit: Payer: Self-pay | Admitting: Family Medicine

## 2021-01-30 DIAGNOSIS — E1169 Type 2 diabetes mellitus with other specified complication: Secondary | ICD-10-CM

## 2021-01-30 DIAGNOSIS — E785 Hyperlipidemia, unspecified: Secondary | ICD-10-CM

## 2021-02-21 ENCOUNTER — Other Ambulatory Visit: Payer: Self-pay | Admitting: Family Medicine

## 2021-02-21 DIAGNOSIS — E1169 Type 2 diabetes mellitus with other specified complication: Secondary | ICD-10-CM

## 2021-02-21 DIAGNOSIS — E785 Hyperlipidemia, unspecified: Secondary | ICD-10-CM

## 2021-02-21 NOTE — Telephone Encounter (Signed)
Requested Prescriptions  Pending Prescriptions Disp Refills  . atorvastatin (LIPITOR) 40 MG tablet [Pharmacy Med Name: ATORVASTATIN 40 MG TABLET] 90 tablet 2    Sig: TAKE 1 TABLET BY MOUTH EVERY DAY     Cardiovascular:  Antilipid - Statins Failed - 02/21/2021  1:31 PM      Failed - HDL in normal range and within 360 days    HDL Cholesterol  Date Value Ref Range Status  01/15/2014 50 40 - 60 mg/dL Final   HDL  Date Value Ref Range Status  12/23/2020 41 (L) > OR = 50 mg/dL Final         Passed - Total Cholesterol in normal range and within 360 days    Cholesterol  Date Value Ref Range Status  12/23/2020 136 <200 mg/dL Final  01/15/2014 153 0 - 200 mg/dL Final         Passed - LDL in normal range and within 360 days    Ldl Cholesterol, Calc  Date Value Ref Range Status  01/15/2014 88 0 - 100 mg/dL Final   LDL Cholesterol (Calc)  Date Value Ref Range Status  12/23/2020 75 mg/dL (calc) Final    Comment:    Reference range: <100 . Desirable range <100 mg/dL for primary prevention;   <70 mg/dL for patients with CHD or diabetic patients  with > or = 2 CHD risk factors. Marland Kitchen LDL-C is now calculated using the Martin-Hopkins  calculation, which is a validated novel method providing  better accuracy than the Friedewald equation in the  estimation of LDL-C.  Cresenciano Genre et al. Annamaria Helling. 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, with long-term current use of insulin [facility])   [facility] Steele Sizer, MD    6 months ago 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, with long-term current use of insulin [facility])   [facility] Steele Sizer, MD   10 months ago 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, with long-term current use of insulin [facility])   [facility] [place], Drue Stager, MD   1 year ago 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

## 2021-03-07 ENCOUNTER — Ambulatory Visit (INDEPENDENT_AMBULATORY_CARE_PROVIDER_SITE_OTHER): Payer: Medicare Other

## 2021-03-07 DIAGNOSIS — Z0000 Encounter for general adult medical examination without abnormal findings: Secondary | ICD-10-CM
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
## 2021-03-07 NOTE — Patient Instructions (Signed)
Patricia Chan , Thank you for taking time to come for your Medicare Wellness Visit. I appreciate your ongoing commitment to your health goals. Please review the following plan we discussed and let me know if I can assist you in the future.   Screening recommendations/referrals: Colonoscopy: done 08/15/17. Repeat in 08/2022 Mammogram: done 08/08/20 Bone Density: done 2011. Please call [phone] to schedule your bone density screening.  Recommended yearly ophthalmology/optometry visit for glaucoma screening and checkup Recommended yearly dental visit for hygiene and checkup  Vaccinations: Influenza vaccine: done 06/25/20 Pneumococcal vaccine: done 03/29/20 Tdap vaccine: done 03/29/20 Shingles vaccine: Shingrix discussed. Please contact your pharmacy for coverage information.  Covid-19: done 08/17/19, 09/14/19, 06/08/20 & 11/15/20  Advanced directives: Please bring a copy of your health care power of attorney and living will to the office at your convenience.   Conditions/risks identified: Recommend healthy eating and physical activity for desired weight loss  Next appointment: Follow up in one year for your annual wellness visit    Preventive Care 65 Years and Older, Female Preventive care refers to lifestyle choices and visits with your health care provider that can promote health and wellness. What does preventive care include? A yearly physical exam. This is also called an annual well check. Dental exams once or twice a year. Routine eye exams. Ask your health care provider how often you should have your eyes checked. Personal lifestyle choices, including: Daily care of your teeth and gums. Regular physical activity. Eating a healthy diet. Avoiding tobacco and drug use. Limiting alcohol use. Practicing safe sex. Taking low-dose aspirin every day. Taking vitamin and mineral supplements as recommended by your health care provider. What happens during an annual well check? The services  and screenings done by your health care provider during your annual well check will depend on your age, overall health, lifestyle risk factors, and family history of disease. Counseling  Your health care provider may ask you questions about your: Alcohol use. Tobacco use. Drug use. Emotional well-being. Home and relationship well-being. Sexual activity. Eating habits. History of falls. Memory and ability to understand (cognition). Work and work Statistician. Reproductive health. Screening  You may have the following tests or measurements: Height, weight, and BMI. Blood pressure. Lipid and cholesterol levels. These may be checked every 5 years, or more frequently if you are over 45 years old. Skin check. Lung cancer screening. You may have this screening every year starting at age 23 if you have a 30-pack-year history of smoking and currently smoke or have quit within the past 15 years. Fecal occult blood test (FOBT) of the stool. You may have this test every year starting at age 46. Flexible sigmoidoscopy or colonoscopy. You may have a sigmoidoscopy every 5 years or a colonoscopy every 10 years starting at age 76. Hepatitis C blood test. Hepatitis B blood test. Sexually transmitted disease (STD) testing. Diabetes screening. This is done by checking your blood sugar (glucose) after you have not eaten for a while (fasting). You may have this done every 1-3 years. Bone density scan. This is done to screen for osteoporosis. You may have this done starting at age 18. Mammogram. This may be done every 1-2 years. Talk to your health care provider about how often you should have regular mammograms. Talk with your health care provider about your test results, treatment options, and if necessary, the need for more tests. Vaccines  Your health care provider may recommend certain vaccines, such as: Influenza vaccine. This is recommended every year.  Tetanus, diphtheria, and acellular pertussis  (Tdap, Td) vaccine. You may need a Td booster every 10 years. Zoster vaccine. You may need this after age 40. Pneumococcal 13-valent conjugate (PCV13) vaccine. One dose is recommended after age 45. Pneumococcal polysaccharide (PPSV23) vaccine. One dose is recommended after age 13. Talk to your health care provider about which screenings and vaccines you need and how often you need them. This information is not intended to replace advice given to you by your health care provider. Make sure you discuss any questions you have with your health care provider. Document Released: 08/19/2015 Document Revised: 04/11/2016 Document Reviewed: 05/24/2015 Elsevier Interactive Patient Education  2017 [place] Prevention in the Home Falls can cause injuries. They can happen to people of all ages. There are many things you can do to make your home safe and to help prevent falls. What can I do on the outside of my home? Regularly fix the edges of walkways and driveways and fix any cracks. Remove anything that might make you trip as you walk through a door, such as a raised step or threshold. Trim any bushes or trees on the path to your home. Use bright outdoor lighting. Clear any walking paths of anything that might make someone trip, such as rocks or tools. Regularly check to see if handrails are loose or broken. Make sure that both sides of any steps have handrails. Any raised decks and porches should have guardrails on the edges. Have any leaves, snow, or ice cleared regularly. Use sand or salt on walking paths during winter. Clean up any spills in your garage right away. This includes oil or grease spills. What can I do in the bathroom? Use night lights. Install grab bars by the toilet and in the tub and shower. Do not use towel bars as grab bars. Use non-skid mats or decals in the tub or shower. If you need to sit down in the shower, use a plastic, non-slip stool. Keep the floor dry. Clean  up any water that spills on the floor as soon as it happens. Remove soap buildup in the tub or shower regularly. Attach bath mats securely with double-sided non-slip rug tape. Do not have throw rugs and other things on the floor that can make you trip. What can I do in the bedroom? Use night lights. Make sure that you have a light by your bed that is easy to reach. Do not use any sheets or blankets that are too big for your bed. They should not hang down onto the floor. Have a firm chair that has side arms. You can use this for support while you get dressed. Do not have throw rugs and other things on the floor that can make you trip. What can I do in the kitchen? Clean up any spills right away. Avoid walking on wet floors. Keep items that you use a lot in easy-to-reach places. If you need to reach something above you, use a strong step stool that has a grab bar. Keep electrical cords out of the way. Do not use floor polish or wax that makes floors slippery. If you must use wax, use non-skid floor wax. Do not have throw rugs and other things on the floor that can make you trip. What can I do with my stairs? Do not leave any items on the stairs. Make sure that there are handrails on both sides of the stairs and use them. Fix handrails that are broken or loose.  Make sure that handrails are as long as the stairways. Check any carpeting to make sure that it is firmly attached to the stairs. Fix any carpet that is loose or worn. Avoid having throw rugs at the top or bottom of the stairs. If you do have throw rugs, attach them to the floor with carpet tape. Make sure that you have a light switch at the top of the stairs and the bottom of the stairs. If you do not have them, ask someone to add them for you. What else can I do to help prevent falls? Wear shoes that: Do not have high heels. Have rubber bottoms. Are comfortable and fit you well. Are closed at the toe. Do not wear sandals. If you  use a stepladder: Make sure that it is fully opened. Do not climb a closed stepladder. Make sure that both sides of the stepladder are locked into place. Ask someone to hold it for you, if possible. Clearly mark and make sure that you can see: Any grab bars or handrails. First and last steps. Where the edge of each step is. Use tools that help you move around (mobility aids) if they are needed. These include: Canes. Walkers. Scooters. Crutches. Turn on the lights when you go into a dark area. Replace any light bulbs as soon as they burn out. Set up your furniture so you have a clear path. Avoid moving your furniture around. If any of your floors are uneven, fix them. If there are any pets around you, be aware of where they are. Review your medicines with your doctor. Some medicines can make you feel dizzy. This can increase your chance of falling. Ask your doctor what other things that you can do to help prevent falls. This information is not intended to replace advice given to you by your health care provider. Make sure you discuss any questions you have with your health care provider. Document Released: 05/19/2009 Document Revised: 12/29/2015 Document Reviewed: 08/27/2014 Elsevier Interactive Patient Education  2017 [employer].

## 2021-03-09 ENCOUNTER — Other Ambulatory Visit: Payer: Self-pay | Admitting: Family Medicine

## 2021-03-09 ENCOUNTER — Ambulatory Visit: Payer: Medicare Other

## 2021-03-25 ENCOUNTER — Other Ambulatory Visit: Payer: Self-pay | Admitting: Family Medicine

## 2021-03-25 DIAGNOSIS — R232 Flushing: Secondary | ICD-10-CM

## 2021-03-25 NOTE — Telephone Encounter (Signed)
Requested Prescriptions  Pending Prescriptions Disp Refills  . 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, with long-term current use of insulin [facility])   [facility] Steele Sizer, MD   7 months ago 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, with long-term current use of insulin [facility])   [facility] Steele Sizer, MD   12 months ago 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, [place]   In 11 months  [place]

## 2021-04-17 ENCOUNTER — Other Ambulatory Visit: Payer: Self-pay | Admitting: Family Medicine

## 2021-04-17 DIAGNOSIS — E113213 Type 2 diabetes mellitus with mild nonproliferative diabetic retinopathy with macular edema, bilateral: Secondary | ICD-10-CM

## 2021-04-26 ENCOUNTER — Telehealth: Payer: Medicare Other | Admitting: Family Medicine

## 2021-05-16 ENCOUNTER — Encounter: Payer: Self-pay | Admitting: Family Medicine

## 2021-05-16 ENCOUNTER — Ambulatory Visit (INDEPENDENT_AMBULATORY_CARE_PROVIDER_SITE_OTHER): Payer: Medicare Other | Admitting: Family Medicine

## 2021-05-16 ENCOUNTER — Other Ambulatory Visit: Payer: Self-pay

## 2021-05-16 VITALS — BP 126/66 | HR 90 | Temp 98.5°F | Resp 16 | Ht 64.0 in | Wt 191.0 lb

## 2021-05-16 DIAGNOSIS — R232 Flushing: Secondary | ICD-10-CM | POA: Diagnosis not present

## 2021-05-16 DIAGNOSIS — Z23 Encounter for immunization: Secondary | ICD-10-CM

## 2021-05-16 DIAGNOSIS — E113213 Type 2 diabetes mellitus with mild nonproliferative diabetic retinopathy with macular edema, bilateral: Secondary | ICD-10-CM

## 2021-05-16 DIAGNOSIS — E1159 Type 2 diabetes mellitus with other circulatory complications: Secondary | ICD-10-CM

## 2021-05-16 DIAGNOSIS — F325 Major depressive disorder, single episode, in full remission: Secondary | ICD-10-CM

## 2021-05-16 DIAGNOSIS — E559 Vitamin D deficiency, unspecified: Secondary | ICD-10-CM

## 2021-05-16 DIAGNOSIS — I152 Hypertension secondary to endocrine disorders: Secondary | ICD-10-CM

## 2021-05-16 DIAGNOSIS — I10 Essential (primary) hypertension: Secondary | ICD-10-CM

## 2021-05-16 DIAGNOSIS — E1169 Type 2 diabetes mellitus with other specified complication: Secondary | ICD-10-CM | POA: Diagnosis not present

## 2021-05-16 DIAGNOSIS — E785 Hyperlipidemia, unspecified: Secondary | ICD-10-CM

## 2021-05-16 DIAGNOSIS — Z794 Long term (current) use of insulin: Secondary | ICD-10-CM | POA: Diagnosis not present

## 2021-05-16 DIAGNOSIS — E538 Deficiency of other specified B group vitamins: Secondary | ICD-10-CM

## 2021-05-16 LAB — POCT GLYCOSYLATED HEMOGLOBIN (HGB A1C): Hemoglobin A1C: 7.2 % — AB (ref 4.0–5.6)

## 2021-05-16 MED ORDER — ATORVASTATIN CALCIUM 40 MG PO TABS: 40.0000 mg | ORAL_TABLET | Freq: Every day | ORAL | 1 refills | Status: DC

## 2021-05-16 MED ORDER — CLONIDINE HCL 0.1 MG PO TABS: 0.1000 mg | ORAL_TABLET | Freq: Every day | ORAL | 1 refills | Status: DC

## 2021-05-16 MED ORDER — TRESIBA FLEXTOUCH 100 UNIT/ML ~~LOC~~ SOPN: 46.0000 [IU] | PEN_INJECTOR | Freq: Every day | SUBCUTANEOUS | 2 refills | Status: DC

## 2021-05-16 MED ORDER — METFORMIN HCL ER 750 MG PO TB24: 1500.0000 mg | ORAL_TABLET | Freq: Every day | ORAL | 1 refills | Status: DC

## 2021-05-16 MED ORDER — INSULIN PEN NEEDLE 30G X 8 MM MISC: 1.0000 | 1 refills | Status: DC | PRN

## 2021-05-16 MED ORDER — VENLAFAXINE HCL ER 75 MG PO CP24: 75.0000 mg | ORAL_CAPSULE | Freq: Every day | ORAL | 1 refills | Status: DC

## 2021-05-16 MED ORDER — LOSARTAN POTASSIUM-HCTZ 50-12.5 MG PO TABS: 1.0000 | ORAL_TABLET | Freq: Every day | ORAL | 1 refills | Status: DC

## 2021-05-16 MED ORDER — PIOGLITAZONE HCL 15 MG PO TABS: 15.0000 mg | ORAL_TABLET | Freq: Every day | ORAL | 1 refills | Status: DC
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
## 2021-05-29 ENCOUNTER — Ambulatory Visit: Payer: Medicare Other | Admitting: Family Medicine

## 2021-07-04 ENCOUNTER — Telehealth: Payer: Self-pay

## 2021-07-04 NOTE — Telephone Encounter (Signed)
Copied from [place] [phone]. Topic: Medical Record Request - Other >> Jul 04, 2021 10:45 AM Pawlus, Brayton Layman A wrote: Pt wanted to know if a copy of her flu vaccines could be faxed over to [place].

## 2021-07-04 NOTE — Telephone Encounter (Signed)
Copied from [place] [phone]. Topic: Medical Record Request - Other >> Jul 04, 2021 10:45 AM Pawlus, Brayton Layman A wrote: Pt wanted to know if a copy of her flu vaccines could be faxed over to [place].

## 2021-07-04 NOTE — Telephone Encounter (Signed)
Completed  Copied from [id number] [phone]. Topic: Medical Record Request - Other >> Jul 04, 2021 10:45 AM Pawlus, Brayton Layman A wrote: Pt wanted to know if a copy of her flu vaccines could be faxed over to [place].

## 2021-07-04 NOTE — Telephone Encounter (Signed)
Copy faxed.  

## 2021-07-05 ENCOUNTER — Other Ambulatory Visit: Payer: Self-pay | Admitting: Family Medicine

## 2021-07-05 DIAGNOSIS — Z1231 Encounter for screening mammogram for malignant neoplasm of breast: Secondary | ICD-10-CM

## 2021-07-21 ENCOUNTER — Telehealth: Payer: Self-pay

## 2021-07-21 MED ORDER — LORATADINE 10 MG PO TABS: 10.0000 mg | ORAL_TABLET | Freq: Every day | ORAL | 2 refills | Status: DC

## 2021-07-24 ENCOUNTER — Other Ambulatory Visit: Payer: Self-pay

## 2021-07-24 NOTE — Telephone Encounter (Signed)
Pt is calling to check on the status of the medication. She states that she is out of the medication.

## 2021-07-25 NOTE — Telephone Encounter (Signed)
Patient called and she says she has picked up the Loratidine, the medication she called about.

## 2021-08-09 ENCOUNTER — Other Ambulatory Visit: Payer: Self-pay

## 2021-08-09 ENCOUNTER — Ambulatory Visit
Admission: RE | Admit: 2021-08-09 | Discharge: 2021-08-09 | Disposition: A | Discharge status: Home / Self Care | Payer: Medicare Other | Source: Ambulatory Visit | Attending: Family Medicine | Admitting: Family Medicine

## 2021-08-09 DIAGNOSIS — Z1231 Encounter for screening mammogram for malignant neoplasm of breast: Secondary | ICD-10-CM | POA: Diagnosis present

## 2021-08-19 ENCOUNTER — Telehealth: Payer: Self-pay | Admitting: Family Medicine

## 2021-08-19 DIAGNOSIS — E113213 Type 2 diabetes mellitus with mild nonproliferative diabetic retinopathy with macular edema, bilateral: Secondary | ICD-10-CM

## 2021-08-19 NOTE — Telephone Encounter (Signed)
last RF 05/16/21 #90 1 RF  Requested Prescriptions  Refused Prescriptions Disp Refills   metFORMIN (GLUCOPHAGE-XR) 750 MG 24 hr tablet [Pharmacy Med Name: METFORMIN HCL ER 750 MG TABLET] 180 tablet     Sig: TAKE 2 TABLETS (1,500 MG TOTAL) BY MOUTH DAILY WITH BREAKFAST.     Endocrinology:  Diabetes - Biguanides Passed - 08/19/2021  9:36 AM      Passed - Cr in normal range and within 360 days    Creat  Date Value Ref Range Status  12/23/2020 0.76 0.50 - 0.99 mg/dL Final    Comment:    For patients >69 years of age, the reference limit for Creatinine is approximately 13% higher for people identified as African-American. .    Creatinine, Urine  Date Value Ref Range Status  12/23/2020 154 20 - 275 mg/dL Final         Passed - HBA1C is between 0 and 7.9 and within 180 days    Hemoglobin A1C  Date Value Ref Range Status  05/16/2021 7.2 (A) 4.0 - 5.6 % Final   HbA1c, POC (controlled diabetic range)  Date Value Ref Range Status  02/11/2019 6.7 0.0 - 7.0 % Final         Passed - AA eGFR in normal range and within 360 days    GFR, Est African American  Date Value Ref Range Status  12/23/2020 95 > OR = 60 mL/min/1.12m2 Final   GFR, Est Non African American  Date Value Ref Range Status  12/23/2020 82 > OR = 60 mL/min/1.64m2 Final         Passed - Valid encounter within last 6 months    Recent Outpatient Visits          3 months ago 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, with long-term current use of insulin [facility])   [facility] Steele Sizer, MD   7 months ago 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, with long-term current use of insulin [facility])   [facility] Steele Sizer, MD   1 year ago 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, with long-term  current use of insulin [facility])   [facility] Steele Sizer, MD   1 year ago 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

## 2021-08-21 ENCOUNTER — Other Ambulatory Visit: Payer: Self-pay

## 2021-08-21 DIAGNOSIS — Z794 Long term (current) use of insulin: Secondary | ICD-10-CM

## 2021-08-21 DIAGNOSIS — E113213 Type 2 diabetes mellitus with mild nonproliferative diabetic retinopathy with macular edema, bilateral: Secondary | ICD-10-CM

## 2021-08-21 MED ORDER — METFORMIN HCL ER 750 MG PO TB24: 1500.0000 mg | ORAL_TABLET | Freq: Every day | ORAL | 1 refills | Status: DC

## 2021-09-15 ENCOUNTER — Other Ambulatory Visit: Payer: Self-pay

## 2021-09-15 ENCOUNTER — Other Ambulatory Visit: Payer: Self-pay | Admitting: Family Medicine

## 2021-09-15 ENCOUNTER — Telehealth: Payer: Self-pay | Admitting: Family Medicine

## 2021-09-15 DIAGNOSIS — Z794 Long term (current) use of insulin: Secondary | ICD-10-CM

## 2021-09-15 DIAGNOSIS — E113213 Type 2 diabetes mellitus with mild nonproliferative diabetic retinopathy with macular edema, bilateral: Secondary | ICD-10-CM

## 2021-09-15 NOTE — Telephone Encounter (Signed)
Spoke with patient and she is going to wait to discuss options at her next office visit on 10/10/21 when we recheck her A1C.

## 2021-09-15 NOTE — Telephone Encounter (Signed)
Patient called in asking or a list Dr Ancil Boozer has that she can take, insullin that helps to loose weight, also. She also mentioned Trulicity. Please call back.

## 2021-09-18 ENCOUNTER — Ambulatory Visit: Payer: Medicare Other | Admitting: Family Medicine
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
## 2021-10-10 ENCOUNTER — Encounter: Payer: Self-pay | Admitting: Family Medicine

## 2021-10-10 ENCOUNTER — Ambulatory Visit (INDEPENDENT_AMBULATORY_CARE_PROVIDER_SITE_OTHER): Payer: Medicare Other | Admitting: Family Medicine

## 2021-10-10 VITALS — BP 134/68 | HR 87 | Resp 16 | Ht 64.0 in | Wt 196.0 lb

## 2021-10-10 DIAGNOSIS — I10 Essential (primary) hypertension: Secondary | ICD-10-CM | POA: Diagnosis not present

## 2021-10-10 DIAGNOSIS — I152 Hypertension secondary to endocrine disorders: Secondary | ICD-10-CM

## 2021-10-10 DIAGNOSIS — E1169 Type 2 diabetes mellitus with other specified complication: Secondary | ICD-10-CM

## 2021-10-10 DIAGNOSIS — E113213 Type 2 diabetes mellitus with mild nonproliferative diabetic retinopathy with macular edema, bilateral: Secondary | ICD-10-CM

## 2021-10-10 DIAGNOSIS — R232 Flushing: Secondary | ICD-10-CM

## 2021-10-10 DIAGNOSIS — E785 Hyperlipidemia, unspecified: Secondary | ICD-10-CM | POA: Diagnosis not present

## 2021-10-10 DIAGNOSIS — F325 Major depressive disorder, single episode, in full remission: Secondary | ICD-10-CM

## 2021-10-10 DIAGNOSIS — Z23 Encounter for immunization: Secondary | ICD-10-CM

## 2021-10-10 DIAGNOSIS — E669 Obesity, unspecified: Secondary | ICD-10-CM

## 2021-10-10 DIAGNOSIS — E559 Vitamin D deficiency, unspecified: Secondary | ICD-10-CM

## 2021-10-10 DIAGNOSIS — Z794 Long term (current) use of insulin: Secondary | ICD-10-CM

## 2021-10-10 DIAGNOSIS — E538 Deficiency of other specified B group vitamins: Secondary | ICD-10-CM | POA: Diagnosis not present

## 2021-10-10 DIAGNOSIS — E1159 Type 2 diabetes mellitus with other circulatory complications: Secondary | ICD-10-CM

## 2021-10-10 MED ORDER — VENLAFAXINE HCL ER 75 MG PO CP24: 75.0000 mg | ORAL_CAPSULE | Freq: Every day | ORAL | 1 refills | Status: DC

## 2021-10-10 MED ORDER — RYBELSUS 7 MG PO TABS: 7.0000 mg | ORAL_TABLET | Freq: Every day | ORAL | 1 refills | Status: DC

## 2021-10-10 MED ORDER — ATORVASTATIN CALCIUM 40 MG PO TABS: 40.0000 mg | ORAL_TABLET | Freq: Every day | ORAL | 1 refills | Status: DC

## 2021-10-10 MED ORDER — CLONIDINE HCL 0.1 MG PO TABS: 0.1000 mg | ORAL_TABLET | Freq: Every day | ORAL | 1 refills | Status: DC

## 2021-10-10 MED ORDER — LOSARTAN POTASSIUM-HCTZ 50-12.5 MG PO TABS: 1.0000 | ORAL_TABLET | Freq: Every day | ORAL | 1 refills | Status: DC

## 2021-10-10 MED ORDER — INSULIN PEN NEEDLE 30G X 8 MM MISC: 1.0000 | 1 refills | Status: DC | PRN

## 2021-10-10 MED ORDER — SHINGRIX 50 MCG/0.5ML IM SUSR
0.5000 mL | Freq: Once | INTRAMUSCULAR | 0 refills | Status: AC
Start: 2021-10-10 — End: 2021-10-10

## 2021-10-10 MED ORDER — PIOGLITAZONE HCL 15 MG PO TABS: 15.0000 mg | ORAL_TABLET | Freq: Every day | ORAL | 1 refills | Status: DC

## 2021-10-11 LAB — COMPLETE METABOLIC PANEL WITH GFR
AG Ratio: 1.8 (calc) (ref 1.0–2.5)
ALT: 13 U/L (ref 6–29)
AST: 11 U/L (ref 10–35)
Albumin: 4.2 g/dL (ref 3.6–5.1)
Alkaline phosphatase (APISO): 71 U/L (ref 37–153)
BUN: 22 mg/dL (ref 7–25)
CO2: 32 mmol/L (ref 20–32)
Calcium: 9.8 mg/dL (ref 8.6–10.4)
Chloride: 101 mmol/L (ref 98–110)
Creat: 0.9 mg/dL (ref 0.50–1.05)
Globulin: 2.3 g/dL (calc) (ref 1.9–3.7)
Glucose, Bld: 117 mg/dL — ABNORMAL HIGH (ref 65–99)
Potassium: 3.9 mmol/L (ref 3.5–5.3)
Sodium: 139 mmol/L (ref 135–146)
Total Bilirubin: 0.3 mg/dL (ref 0.2–1.2)
Total Protein: 6.5 g/dL (ref 6.1–8.1)
eGFR: 71 mL/min/{1.73_m2} (ref >=60)

## 2021-10-11 LAB — LIPID PANEL
Cholesterol: 130 mg/dL (ref <200)
HDL: 46 mg/dL — ABNORMAL LOW (ref >=50)
LDL Cholesterol (Calc): 65 mg/dL (calc)
Non-HDL Cholesterol (Calc): 84 mg/dL (calc) (ref <130)
Total CHOL/HDL Ratio: 2.8 (calc) (ref <5.0)
Triglycerides: 103 mg/dL (ref <150)

## 2021-10-11 LAB — HEMOGLOBIN A1C
Hgb A1c MFr Bld: 8.7 % of total Hgb — ABNORMAL HIGH (ref <5.7)
Mean Plasma Glucose: 203 mg/dL
eAG (mmol/L): 11.2 mmol/L

## 2021-10-19 ENCOUNTER — Other Ambulatory Visit: Payer: Self-pay | Admitting: Family Medicine

## 2021-10-24 ENCOUNTER — Telehealth: Payer: Self-pay

## 2021-10-24 NOTE — Telephone Encounter (Signed)
Copied from [place] [id number]. Topic: General - Other >> Oct 24, 2021  3:25 PM Greggory Keen D wrote: Reason for CRM: Pt called saying the Rybelsus medication is too expensive.  She said she can not afford this medication [phone]

## 2021-11-21 ENCOUNTER — Other Ambulatory Visit: Payer: Self-pay | Admitting: Family Medicine

## 2021-11-21 DIAGNOSIS — E113213 Type 2 diabetes mellitus with mild nonproliferative diabetic retinopathy with macular edema, bilateral: Secondary | ICD-10-CM

## 2021-11-28 DIAGNOSIS — M1711 Unilateral primary osteoarthritis, right knee: Secondary | ICD-10-CM | POA: Insufficient documentation

## 2021-12-26 ENCOUNTER — Other Ambulatory Visit: Payer: Self-pay | Admitting: Family Medicine

## 2021-12-26 DIAGNOSIS — E113213 Type 2 diabetes mellitus with mild nonproliferative diabetic retinopathy with macular edema, bilateral: Secondary | ICD-10-CM

## 2022-01-10 ENCOUNTER — Telehealth: Payer: Self-pay

## 2022-01-10 NOTE — Telephone Encounter (Signed)
Copied from [place] [id number]. Topic: General - Other >> Jan 10, 2022  9:30 AM Tessa Lerner A wrote: Reason for CRM: The patient would like to know fi there are any samples of Rybelsus 7 MG available   Please contact further when possible

## 2022-01-10 NOTE — Telephone Encounter (Signed)
Called patient and left vm letting her know there is not a low cost substitute.
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
## 2022-02-07 ENCOUNTER — Ambulatory Visit (INDEPENDENT_AMBULATORY_CARE_PROVIDER_SITE_OTHER): Payer: Medicare Other | Admitting: Family Medicine

## 2022-02-07 ENCOUNTER — Encounter: Payer: Self-pay | Admitting: Family Medicine

## 2022-02-07 VITALS — BP 132/64 | HR 90 | Temp 98.2°F | Resp 16 | Ht 64.0 in | Wt 172.4 lb

## 2022-02-07 DIAGNOSIS — F325 Major depressive disorder, single episode, in full remission: Secondary | ICD-10-CM

## 2022-02-07 DIAGNOSIS — E113213 Type 2 diabetes mellitus with mild nonproliferative diabetic retinopathy with macular edema, bilateral: Secondary | ICD-10-CM | POA: Diagnosis not present

## 2022-02-07 DIAGNOSIS — E785 Hyperlipidemia, unspecified: Secondary | ICD-10-CM

## 2022-02-07 DIAGNOSIS — E1169 Type 2 diabetes mellitus with other specified complication: Secondary | ICD-10-CM

## 2022-02-07 DIAGNOSIS — R232 Flushing: Secondary | ICD-10-CM | POA: Diagnosis not present

## 2022-02-07 DIAGNOSIS — I10 Essential (primary) hypertension: Secondary | ICD-10-CM

## 2022-02-07 DIAGNOSIS — Z794 Long term (current) use of insulin: Secondary | ICD-10-CM

## 2022-02-07 LAB — POCT GLYCOSYLATED HEMOGLOBIN (HGB A1C): Hemoglobin A1C: 6.7 % — AB (ref 4.0–5.6)

## 2022-02-07 MED ORDER — CLONIDINE HCL 0.1 MG PO TABS: 0.1000 mg | ORAL_TABLET | Freq: Every day | ORAL | 1 refills | Status: DC

## 2022-02-07 MED ORDER — VENLAFAXINE HCL ER 75 MG PO CP24: 75.0000 mg | ORAL_CAPSULE | Freq: Every day | ORAL | 1 refills | Status: DC

## 2022-02-07 MED ORDER — LOSARTAN POTASSIUM-HCTZ 50-12.5 MG PO TABS: 1.0000 | ORAL_TABLET | Freq: Every day | ORAL | 1 refills | Status: DC

## 2022-02-07 MED ORDER — SOLIQUA 100-33 UNT-MCG/ML ~~LOC~~ SOPN: 50.0000 [IU] | PEN_INJECTOR | Freq: Every day | SUBCUTANEOUS | 1 refills | Status: DC

## 2022-02-07 MED ORDER — ATORVASTATIN CALCIUM 40 MG PO TABS: 40.0000 mg | ORAL_TABLET | Freq: Every day | ORAL | 1 refills | Status: DC

## 2022-02-07 MED ORDER — METFORMIN HCL ER 750 MG PO TB24: 750.0000 mg | ORAL_TABLET | Freq: Two times a day (BID) | ORAL | 1 refills | Status: DC

## 2022-02-08 ENCOUNTER — Other Ambulatory Visit: Payer: Self-pay | Admitting: Family Medicine

## 2022-02-08 ENCOUNTER — Telehealth: Payer: Self-pay | Admitting: Family Medicine

## 2022-02-08 LAB — MICROALBUMIN / CREATININE URINE RATIO
Creatinine, Urine: 79 mg/dL (ref 20–275)
Microalb Creat Ratio: 4 mcg/mg creat (ref <30)
Microalb, Ur: 0.3 mg/dL

## 2022-02-08 MED ORDER — LORATADINE 10 MG PO TABS: 10.0000 mg | ORAL_TABLET | Freq: Every day | ORAL | 1 refills | Status: DC

## 2022-02-08 NOTE — Telephone Encounter (Signed)
Patient called to let Dr Ancil Boozer know she is taking Loratine '10mg'$  for her allergies.

## 2022-02-16 IMAGING — MG MM DIGITAL SCREENING BILAT W/ TOMO AND CAD
6 of 10 series · 6 of 30 positions shown · non-contrast
Comparison: Previous exam(s).

CLINICAL DATA: Screening.

EXAM:
DIGITAL SCREENING BILATERAL MAMMOGRAM WITH TOMOSYNTHESIS AND CAD
TECHNIQUE: Bilateral screening digital craniocaudal and mediolateral oblique
mammograms were obtained. Bilateral screening digital breast
tomosynthesis was performed. The images were evaluated with
computer-aided detection.

[R MLO synth-2D (1 of 2)]
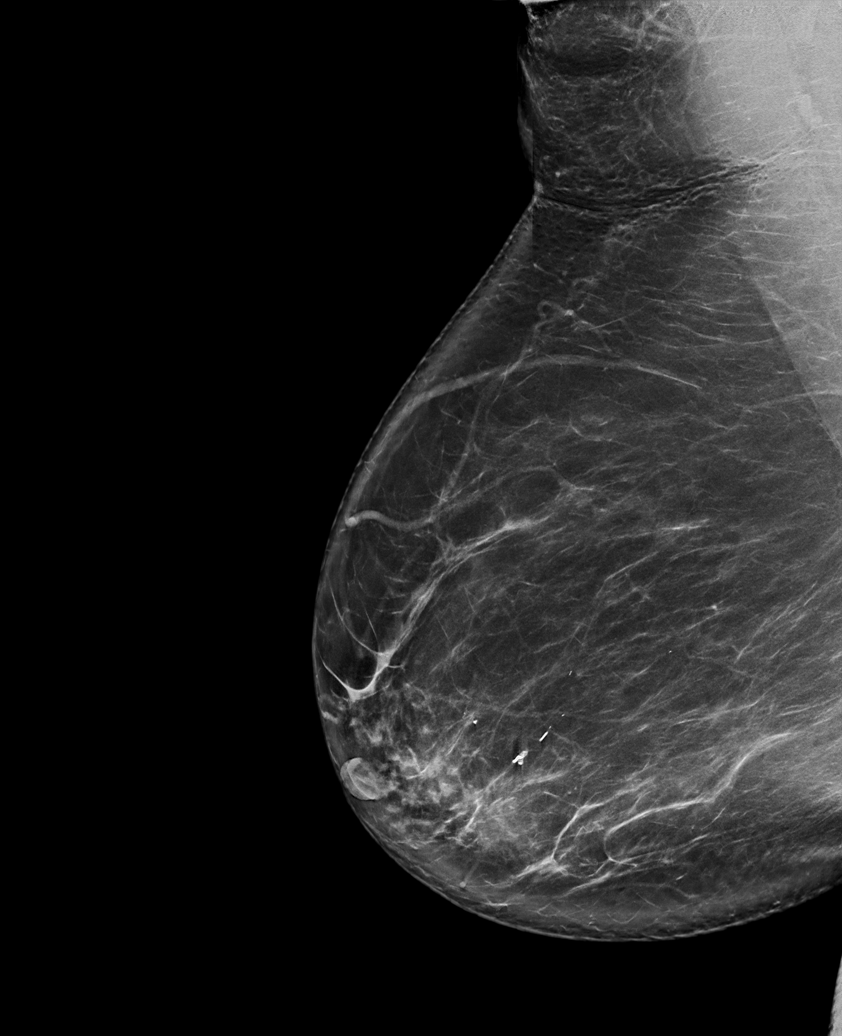

[L CC synth-2D]
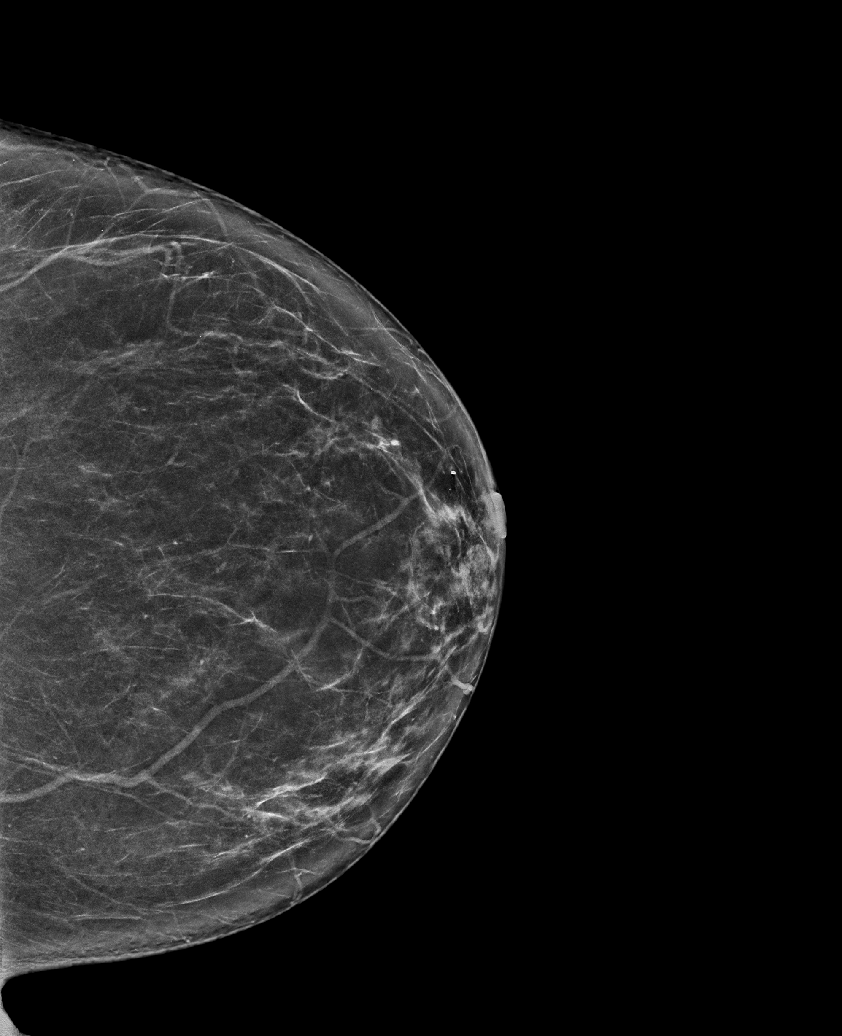

[L MLO synth-2D]
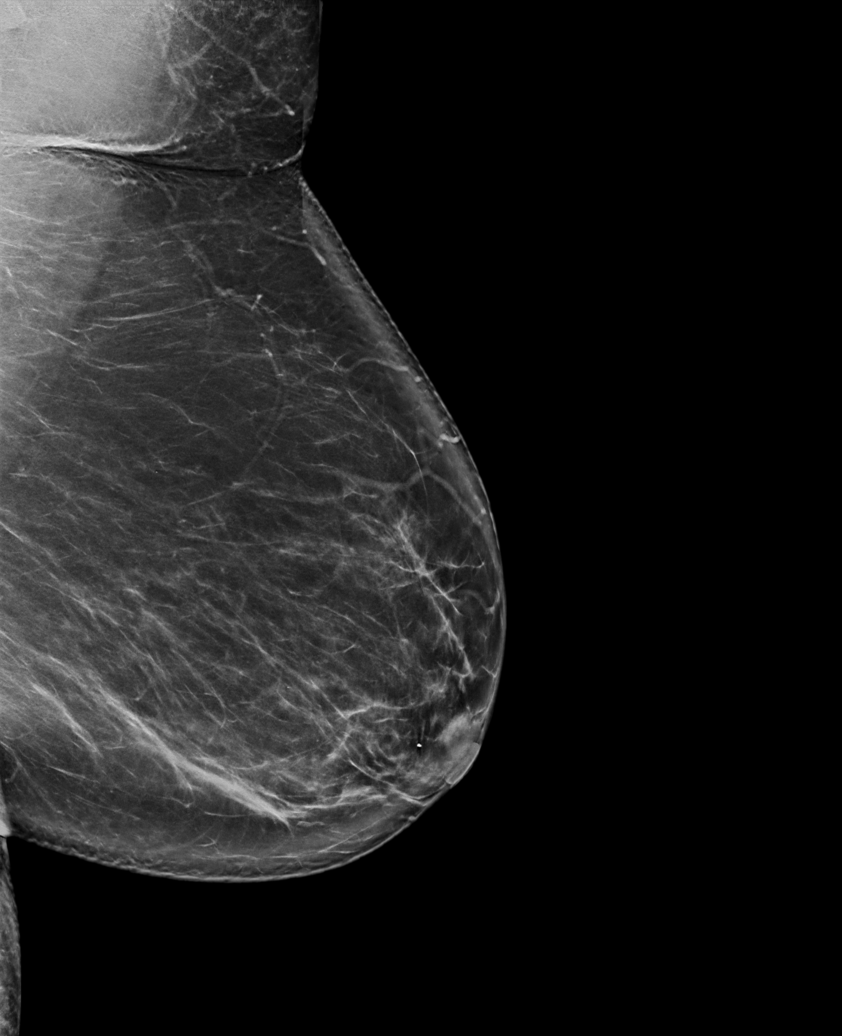

[R MLO synth-2D (2 of 2)]
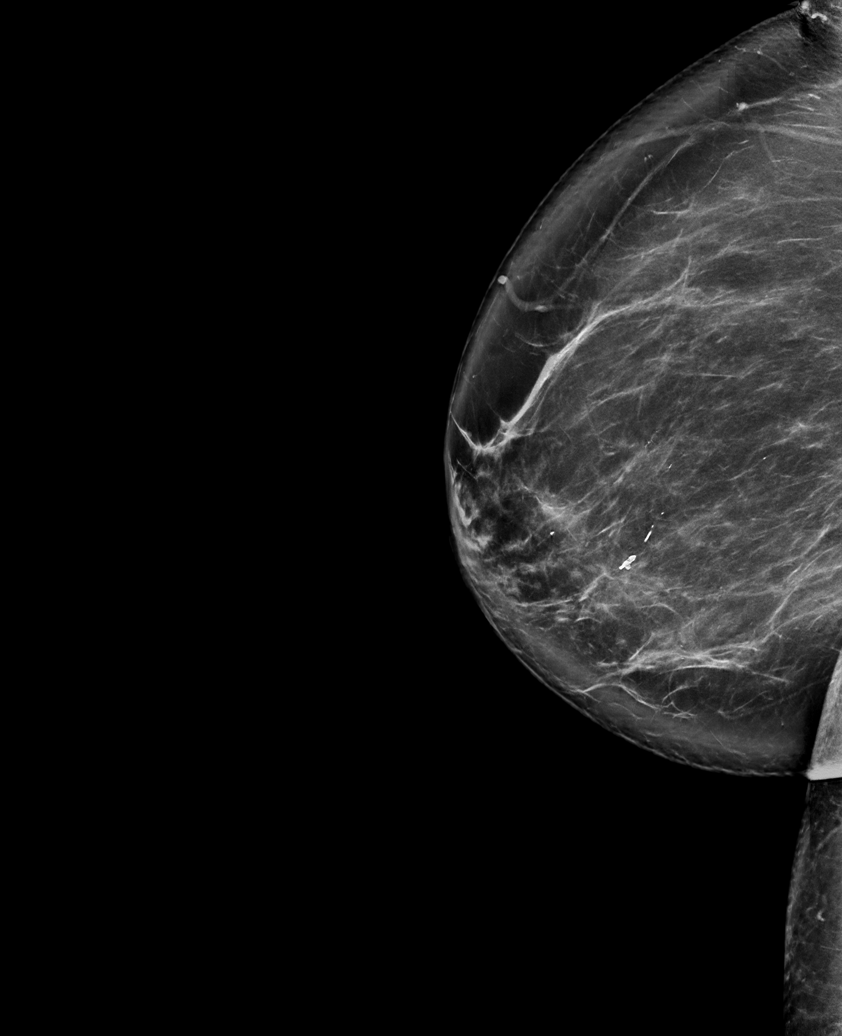

[R CC synth-2D]
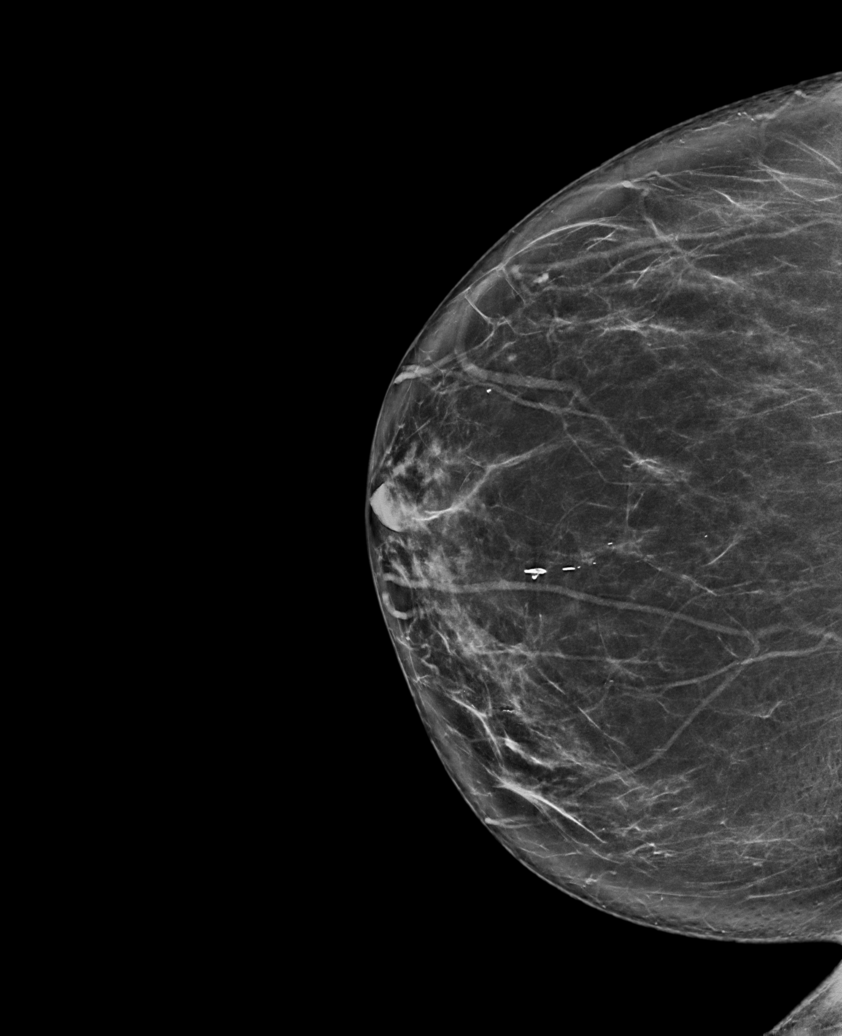

[R CC tomo · tomo slice 43/85.0]
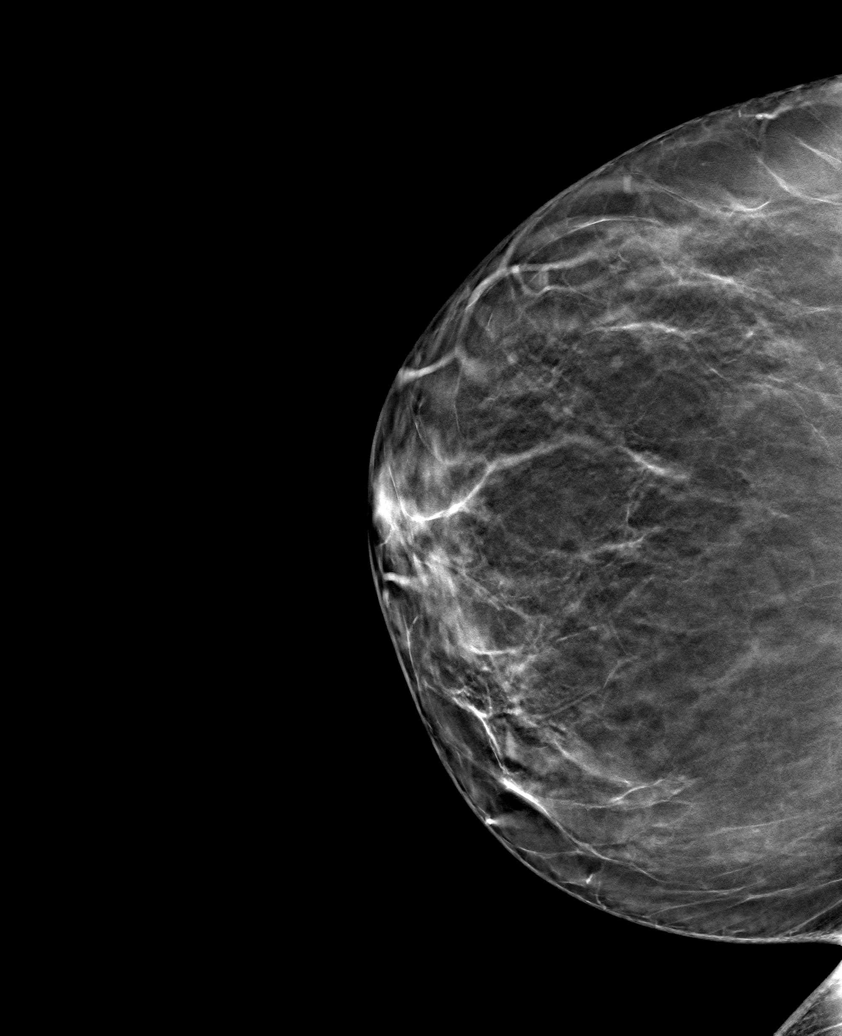

[6 of 30 positions shown; findings below may reference images not displayed]

ACR Breast Density Category b: There are scattered areas of
fibroglandular density.
FINDINGS: There are no findings suspicious for malignancy.
IMPRESSION: No mammographic evidence of malignancy. A result letter of this
screening mammogram will be mailed directly to the patient.

RECOMMENDATION:
Screening mammogram in one year. (Code:51-O-LD2)

BI-RADS CATEGORY  1: Negative.

## 2022-03-08 ENCOUNTER — Ambulatory Visit (INDEPENDENT_AMBULATORY_CARE_PROVIDER_SITE_OTHER): Payer: Medicare Other

## 2022-03-08 VITALS — Ht 64.0 in | Wt 172.0 lb

## 2022-03-08 DIAGNOSIS — Z0000 Encounter for general adult medical examination without abnormal findings: Secondary | ICD-10-CM | POA: Diagnosis not present

## 2022-03-08 DIAGNOSIS — Z1211 Encounter for screening for malignant neoplasm of colon: Secondary | ICD-10-CM | POA: Diagnosis not present
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
## 2022-03-12 ENCOUNTER — Telehealth: Payer: Self-pay

## 2022-03-12 NOTE — Telephone Encounter (Signed)
Contacted patient to schedule her colonoscopy.  She said her insurance will not cover it until Feb 2024.  Her referral will be deferred until then.  Thanks,  [place], [place]

## 2022-04-24 ENCOUNTER — Other Ambulatory Visit: Payer: Self-pay | Admitting: Family Medicine

## 2022-04-24 DIAGNOSIS — E1169 Type 2 diabetes mellitus with other specified complication: Secondary | ICD-10-CM

## 2022-04-24 NOTE — Telephone Encounter (Signed)
Last seen 7/5

## 2022-06-04 ENCOUNTER — Telehealth: Payer: Self-pay | Admitting: Family Medicine

## 2022-06-04 DIAGNOSIS — E1169 Type 2 diabetes mellitus with other specified complication: Secondary | ICD-10-CM

## 2022-06-06 ENCOUNTER — Other Ambulatory Visit: Payer: Self-pay

## 2022-06-06 ENCOUNTER — Other Ambulatory Visit: Payer: Self-pay | Admitting: Family Medicine

## 2022-06-06 DIAGNOSIS — E1169 Type 2 diabetes mellitus with other specified complication: Secondary | ICD-10-CM

## 2022-06-06 NOTE — Telephone Encounter (Signed)
  Patient checking on the status of her insulin and states she is completley out. Informed patient request came through from her pharmacy on 10/30 please allow 48-72 hour turn around time Patient would like request expedited.   [id number] [place] [facility] [address] Phone:  [phone] Fax:  [phone]

## 2022-06-06 NOTE — Telephone Encounter (Signed)
Pt informed and will go pick up the meds
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
## 2022-06-12 ENCOUNTER — Ambulatory Visit (INDEPENDENT_AMBULATORY_CARE_PROVIDER_SITE_OTHER): Payer: Medicare Other | Admitting: Family Medicine

## 2022-06-12 ENCOUNTER — Encounter: Payer: Self-pay | Admitting: Family Medicine

## 2022-06-12 VITALS — BP 128/72 | HR 95 | Resp 16 | Ht 64.0 in | Wt 173.0 lb

## 2022-06-12 DIAGNOSIS — I10 Essential (primary) hypertension: Secondary | ICD-10-CM | POA: Diagnosis not present

## 2022-06-12 DIAGNOSIS — E1169 Type 2 diabetes mellitus with other specified complication: Secondary | ICD-10-CM | POA: Diagnosis not present

## 2022-06-12 DIAGNOSIS — E785 Hyperlipidemia, unspecified: Secondary | ICD-10-CM

## 2022-06-12 DIAGNOSIS — F325 Major depressive disorder, single episode, in full remission: Secondary | ICD-10-CM | POA: Diagnosis not present

## 2022-06-12 DIAGNOSIS — E538 Deficiency of other specified B group vitamins: Secondary | ICD-10-CM | POA: Diagnosis not present

## 2022-06-12 DIAGNOSIS — E1159 Type 2 diabetes mellitus with other circulatory complications: Secondary | ICD-10-CM

## 2022-06-12 DIAGNOSIS — E669 Obesity, unspecified: Secondary | ICD-10-CM

## 2022-06-12 DIAGNOSIS — E559 Vitamin D deficiency, unspecified: Secondary | ICD-10-CM

## 2022-06-12 DIAGNOSIS — I152 Hypertension secondary to endocrine disorders: Secondary | ICD-10-CM

## 2022-06-12 LAB — POCT GLYCOSYLATED HEMOGLOBIN (HGB A1C): Hemoglobin A1C: 8.5 % — AB (ref 4.0–5.6)

## 2022-06-12 MED ORDER — PIOGLITAZONE HCL 15 MG PO TABS: 15.0000 mg | ORAL_TABLET | Freq: Every day | ORAL | 1 refills | Status: DC

## 2022-06-12 MED ORDER — SOLIQUA 100-33 UNT-MCG/ML ~~LOC~~ SOPN: 50.0000 [IU] | PEN_INJECTOR | Freq: Every day | SUBCUTANEOUS | 0 refills | Status: DC
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
## 2022-06-21 ENCOUNTER — Ambulatory Visit (INDEPENDENT_AMBULATORY_CARE_PROVIDER_SITE_OTHER): Payer: Medicare Other | Admitting: Family Medicine

## 2022-06-21 ENCOUNTER — Encounter: Payer: Self-pay | Admitting: Family Medicine

## 2022-06-21 VITALS — BP 112/60 | HR 88 | Temp 97.8°F | Resp 14 | Ht 64.0 in | Wt 174.6 lb

## 2022-06-21 DIAGNOSIS — R21 Rash and other nonspecific skin eruption: Secondary | ICD-10-CM | POA: Diagnosis not present

## 2022-06-21 MED ORDER — TRIAMCINOLONE ACETONIDE 0.1 % EX CREA: 1.0000 | TOPICAL_CREAM | Freq: Two times a day (BID) | CUTANEOUS | 0 refills | Status: DC

## 2022-06-21 MED ORDER — HYDROXYZINE HCL 10 MG PO TABS: 10.0000 mg | ORAL_TABLET | Freq: Three times a day (TID) | ORAL | 0 refills | Status: DC | PRN

## 2022-07-10 ENCOUNTER — Other Ambulatory Visit: Payer: Self-pay | Admitting: Family Medicine

## 2022-07-10 ENCOUNTER — Telehealth: Payer: Self-pay | Admitting: Family Medicine

## 2022-07-10 DIAGNOSIS — Z1231 Encounter for screening mammogram for malignant neoplasm of breast: Secondary | ICD-10-CM

## 2022-07-10 NOTE — Telephone Encounter (Signed)
Let patient know she can obtain the RSV from her pharmacy.

## 2022-07-10 NOTE — Telephone Encounter (Signed)
Copied from [place] [id number]. Topic: General - Other >> Jul 10, 2022  4:02 PM [place] B wrote: Reason for CRM: patient called in about getting rsv shot. Please call back if can be done

## 2022-07-16 ENCOUNTER — Other Ambulatory Visit: Payer: Self-pay | Admitting: Family Medicine

## 2022-07-16 DIAGNOSIS — R21 Rash and other nonspecific skin eruption: Secondary | ICD-10-CM

## 2022-07-17 ENCOUNTER — Ambulatory Visit
Admission: RE | Admit: 2022-07-17 | Discharge: 2022-07-17 | Disposition: A | Discharge status: Home / Self Care | Payer: Medicare Other | Source: Ambulatory Visit | Attending: Infectious Diseases | Admitting: Infectious Diseases

## 2022-07-17 ENCOUNTER — Ambulatory Visit: Payer: Medicare Other | Attending: Infectious Diseases | Admitting: Infectious Diseases

## 2022-07-17 ENCOUNTER — Ambulatory Visit
Admission: RE | Admit: 2022-07-17 | Discharge: 2022-07-17 | Disposition: A | Discharge status: Home / Self Care | Payer: Medicare Other | Attending: Infectious Diseases | Admitting: Infectious Diseases

## 2022-07-17 ENCOUNTER — Encounter: Payer: Self-pay | Admitting: Infectious Diseases

## 2022-07-17 VITALS — BP 107/65 | HR 92 | Temp 97.2°F | Ht 64.0 in | Wt 178.0 lb

## 2022-07-17 DIAGNOSIS — E785 Hyperlipidemia, unspecified: Secondary | ICD-10-CM | POA: Insufficient documentation

## 2022-07-17 DIAGNOSIS — R7612 Nonspecific reaction to cell mediated immunity measurement of gamma interferon antigen response without active tuberculosis: Secondary | ICD-10-CM

## 2022-07-17 DIAGNOSIS — L409 Psoriasis, unspecified: Secondary | ICD-10-CM | POA: Insufficient documentation

## 2022-07-17 DIAGNOSIS — Z794 Long term (current) use of insulin: Secondary | ICD-10-CM | POA: Insufficient documentation

## 2022-07-17 DIAGNOSIS — Z7984 Long term (current) use of oral hypoglycemic drugs: Secondary | ICD-10-CM | POA: Insufficient documentation

## 2022-07-17 DIAGNOSIS — I10 Essential (primary) hypertension: Secondary | ICD-10-CM | POA: Diagnosis not present

## 2022-07-17 DIAGNOSIS — E119 Type 2 diabetes mellitus without complications: Secondary | ICD-10-CM | POA: Diagnosis not present

## 2022-07-17 DIAGNOSIS — R7611 Nonspecific reaction to tuberculin skin test without active tuberculosis: Secondary | ICD-10-CM | POA: Insufficient documentation

## 2022-07-17 DIAGNOSIS — Z79899 Other long term (current) drug therapy: Secondary | ICD-10-CM | POA: Insufficient documentation
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
## 2022-07-17 NOTE — Patient Instructions (Signed)
You are referred to me for a positive quantiferon gold test . You say you have been treated for TB exposure many years at [facility] and took 6 months of rx- I will confirm that with health dept Today will get CXR

## 2022-07-19 ENCOUNTER — Telehealth: Payer: Self-pay

## 2022-07-19 NOTE — Telephone Encounter (Signed)
Patient informed of CXR results. I also informed her that she does not need any additional treatment for TB and the HD confirmed she had 9 months of treatment.  Patricia Chan

## 2022-07-19 NOTE — Telephone Encounter (Signed)
-----   Message from Tsosie Billing, MD sent at 07/19/2022 12:08 PM EST ----- Please let her know that the health department called and she had taken 9 months of [place] 2009/2010 So she does not need any further treatment. CXR taken that was normal as well. We will let her dermatologist know as well  ----- Message ----- From: Interface, Rad Results In Sent: 07/17/2022   1:58 PM EST To: Tsosie Billing, MD

## 2022-07-21 ENCOUNTER — Other Ambulatory Visit: Payer: Self-pay | Admitting: Family Medicine

## 2022-07-21 DIAGNOSIS — F325 Major depressive disorder, single episode, in full remission: Secondary | ICD-10-CM

## 2022-07-21 DIAGNOSIS — R232 Flushing: Secondary | ICD-10-CM

## 2022-08-08 ENCOUNTER — Telehealth: Payer: Self-pay

## 2022-08-08 ENCOUNTER — Other Ambulatory Visit: Payer: Self-pay

## 2022-08-08 DIAGNOSIS — Z86010 Personal history of colonic polyps: Secondary | ICD-10-CM

## 2022-08-08 NOTE — Telephone Encounter (Signed)
Gastroenterology Pre-Procedure Review  Request Date: 09/07/22 Requesting Physician: Dr. Vicente Males  PATIENT REVIEW QUESTIONS: The patient responded to the following health history questions as indicated:    1. Are you having any GI issues? no 2. Do you have a personal history of Polyps? yes (08/15/2017 performed by Dr. Vicente Males) 3. Do you have a family history of Colon Cancer or Polyps? no 4. Diabetes Mellitus? yes (patient has been advised to stop Metformin 2 days prior to colonoscopy on 01/30) 5. Joint replacements in the past 12 months?no 6. Major health problems in the past 3 months?no 7. Any artificial heart valves, MVP, or defibrillator?no    MEDICATIONS & ALLERGIES:    Patient reports the following regarding taking any anticoagulation/antiplatelet therapy:   Plavix, Coumadin, Eliquis, Xarelto, Lovenox, Pradaxa, Brilinta, or Effient? no Aspirin? no  Patient confirms/reports the following medications:  Current Outpatient Medications  Medication Sig Dispense Refill   acetaminophen (TYLENOL) 500 MG tablet Take 1 tablet (500 mg total) by mouth every 8 (eight) hours as needed. (Patient taking differently: Take 500 mg by mouth every 8 (eight) hours as needed. Take 2 in the morning, and 2 at bedtime) 90 tablet 0   atorvastatin (LIPITOR) 40 MG tablet Take 1 tablet (40 mg total) by mouth daily. 90 tablet 1   Cholecalciferol (VITAMIN D) 2000 units CAPS Take 1 capsule by mouth daily.     cloNIDine (CATAPRES) 0.1 MG tablet TAKE 1 TABLET BY MOUTH AT BEDTIME. 90 tablet 1   Cyanocobalamin (B-12) 1000 MCG SUBL Place 1 tablet under the tongue daily. 30 each 5   Fluocinolone Acetonide Scalp 0.01 % OIL Derma-Smoothe/FS Scalp Oil 0.01 %  APPLY TO SCALP DAILY AS NEEDED     hydrOXYzine (ATARAX) 10 MG tablet Take 1 tablet (10 mg total) by mouth 3 (three) times daily as needed. 30 tablet 0   Insulin Glargine-Lixisenatide (SOLIQUA) 100-33 UNT-MCG/ML SOPN Inject 50-60 Units into the skin daily at 12 noon. 45 mL 0    Insulin Pen Needle (NOVOFINE AUTOCOVER) 30G X 8 MM MISC Inject 10 each into the skin as needed. 100 each 1   ipratropium (ATROVENT) 0.03 % nasal spray Place into both nostrils.     loratadine (CLARITIN) 10 MG tablet TAKE 1 TABLET BY MOUTH EVERY DAY 90 tablet 1   losartan-hydrochlorothiazide (HYZAAR) 50-12.5 MG tablet Take 1 tablet by mouth daily. 90 tablet 1   metFORMIN (GLUCOPHAGE-XR) 750 MG 24 hr tablet Take 1 tablet (750 mg total) by mouth in the morning and at bedtime. 180 tablet 1   pioglitazone (ACTOS) 15 MG tablet Take 1 tablet (15 mg total) by mouth daily. 90 tablet 1   triamcinolone cream (KENALOG) 0.1 % APPLY TO AFFECTED AREA TWICE A DAY 454 g 0   venlafaxine XR (EFFEXOR-XR) 75 MG 24 hr capsule TAKE 1 CAPSULE BY MOUTH DAILY WITH BREAKFAST. 90 capsule 1   No current facility-administered medications for this visit.    Patient confirms/reports the following allergies:  No Known Allergies  No orders of the defined types were placed in this encounter.   AUTHORIZATION INFORMATION Primary Insurance: 1D#: Group #:  Secondary Insurance: 1D#: Group #:  SCHEDULE INFORMATION: Date: 09/07/22 Time: Location: [facility]

## 2022-08-10 ENCOUNTER — Ambulatory Visit
Admission: RE | Admit: 2022-08-10 | Discharge: 2022-08-10 | Disposition: A | Discharge status: Home / Self Care | Payer: Medicare Other | Source: Ambulatory Visit | Attending: Family Medicine | Admitting: Family Medicine

## 2022-08-10 DIAGNOSIS — Z1231 Encounter for screening mammogram for malignant neoplasm of breast: Secondary | ICD-10-CM | POA: Insufficient documentation

## 2022-09-07 ENCOUNTER — Encounter
Admission: RE | Disposition: A | Discharge status: Home / Self Care | Payer: Self-pay | Source: Home / Self Care | Attending: Gastroenterology

## 2022-09-07 ENCOUNTER — Ambulatory Visit: Payer: Medicare Other | Admitting: Anesthesiology

## 2022-09-07 ENCOUNTER — Ambulatory Visit
Admission: RE | Admit: 2022-09-07 | Discharge: 2022-09-07 | Disposition: A | Discharge status: Home / Self Care | Payer: Medicare Other | Attending: Gastroenterology | Admitting: Gastroenterology

## 2022-09-07 ENCOUNTER — Encounter: Payer: Self-pay | Admitting: Gastroenterology

## 2022-09-07 DIAGNOSIS — F32A Depression, unspecified: Secondary | ICD-10-CM | POA: Diagnosis not present

## 2022-09-07 DIAGNOSIS — Z1211 Encounter for screening for malignant neoplasm of colon: Secondary | ICD-10-CM | POA: Insufficient documentation

## 2022-09-07 DIAGNOSIS — K635 Polyp of colon: Secondary | ICD-10-CM | POA: Diagnosis not present

## 2022-09-07 DIAGNOSIS — Z860100 Personal history of colon polyps, unspecified: Secondary | ICD-10-CM

## 2022-09-07 DIAGNOSIS — E119 Type 2 diabetes mellitus without complications: Secondary | ICD-10-CM | POA: Insufficient documentation

## 2022-09-07 DIAGNOSIS — D126 Benign neoplasm of colon, unspecified: Secondary | ICD-10-CM | POA: Diagnosis not present

## 2022-09-07 DIAGNOSIS — I10 Essential (primary) hypertension: Secondary | ICD-10-CM | POA: Insufficient documentation

## 2022-09-07 HISTORY — PX: COLONOSCOPY WITH PROPOFOL: SHX5780

## 2022-09-07 LAB — GLUCOSE, CAPILLARY: Glucose-Capillary: 104 mg/dL — ABNORMAL HIGH (ref 70–99)

## 2022-09-07 SURGERY — COLONOSCOPY WITH PROPOFOL
Anesthesia: General

## 2022-09-07 MED ORDER — LIDOCAINE HCL (CARDIAC) PF 100 MG/5ML IV SOSY
PREFILLED_SYRINGE | INTRAVENOUS | Status: DC | PRN
  Administered 2022-09-07: 40 mg via INTRAVENOUS

## 2022-09-07 MED ORDER — SODIUM CHLORIDE 0.9 % IV SOLN: INTRAVENOUS | Status: DC

## 2022-09-07 MED ORDER — PROPOFOL 10 MG/ML IV BOLUS
INTRAVENOUS | Status: DC | PRN
  Administered 2022-09-07: 80 mg via INTRAVENOUS

## 2022-09-07 MED ORDER — PROPOFOL 500 MG/50ML IV EMUL
INTRAVENOUS | Status: DC | PRN
  Administered 2022-09-07: 150 ug/kg/min via INTRAVENOUS

## 2022-09-07 MED ORDER — DEXMEDETOMIDINE HCL IN NACL 200 MCG/50ML IV SOLN
INTRAVENOUS | Status: DC | PRN
  Administered 2022-09-07: 8 ug via INTRAVENOUS
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
## 2022-09-07 NOTE — Transfer of Care (Signed)
Immediate Anesthesia Transfer of Care Note  Patient: [name]  Procedure(s) Performed: Procedure(s): COLONOSCOPY WITH PROPOFOL (N/A)  Patient Location: PACU and Endoscopy Unit  Anesthesia Type:General  Level of Consciousness: sedated  Airway & Oxygen Therapy: Patient Spontanous Breathing and Patient connected to nasal cannula oxygen  Post-op Assessment: Report given to RN and Post -op Vital signs reviewed and stable  Post vital signs: Reviewed and stable  Last Vitals:  Vitals:   09/07/22 0929 09/07/22 1035  BP: 118/88 (!) 103/55  Pulse: 78 72  Resp: 16 (!) 22  Temp: (!) 35.9 C   SpO2: 17% 711%    Complications: No apparent anesthesia complications

## 2022-09-07 NOTE — Op Note (Signed)
[facility] Gastroenterology Patient Name: Patricia Chan Procedure Date: 09/07/2022 10:06 AM MRN: [id number] Account #: [id number] Date of Birth: 1956-02-19 Admit Type: Outpatient Age: 67 Room: [facility] ENDO ROOM 4 Gender: Female Note Status: Finalized Instrument Name: Park Meo [id number] Procedure:             Colonoscopy Indications:           Surveillance: Personal history of adenomatous polyps                         on last colonoscopy 5 years ago Providers:             Jonathon Bellows MD, MD Medicines:             Monitored Anesthesia Care Complications:         No immediate complications. Procedure:             Pre-Anesthesia Assessment:                        - Prior to the procedure, a History and Physical was                         performed, and patient medications, allergies and                         sensitivities were reviewed. The patient's tolerance                         of previous anesthesia was reviewed.                        - The risks and benefits of the procedure and the                         sedation options and risks were discussed with the                         patient. All questions were answered and informed                         consent was obtained.                        - After reviewing the risks and benefits, the patient                         was deemed in satisfactory condition to undergo the                         procedure.                        - ASA Grade Assessment: II - A patient with mild                         systemic disease.                        After obtaining informed consent, the colonoscope was  passed under direct vision. Throughout the procedure,                         the patient's blood pressure, pulse, and oxygen                         saturations were monitored continuously. The                         Colonoscope was introduced through the anus and                          advanced to the the cecum, identified by the                         appendiceal orifice. The colonoscopy was performed                         with ease. The patient tolerated the procedure well.                         The quality of the bowel preparation was excellent.                         The ileocecal valve, appendiceal orifice, and rectum                         were photographed. Findings:      The perianal and digital rectal examinations were normal.      A 3 mm polyp was found in the ascending colon. The polyp was sessile.       The polyp was removed with a cold biopsy forceps. Resection and       retrieval were complete.      The exam was otherwise without abnormality on direct and retroflexion       views. Impression:            - One 3 mm polyp in the ascending colon, removed with                         a cold biopsy forceps. Resected and retrieved.                        - The examination was otherwise normal on direct and                         retroflexion views. Recommendation:        - Discharge patient to home (with escort).                        - Resume previous diet.                        - Continue present medications.                        - Await pathology results.                        - Repeat colonoscopy for surveillance based  on                         pathology results. Procedure Code(s):     --- Professional ---                        [phone], Colonoscopy, flexible; with biopsy, single or                         multiple Diagnosis Code(s):     --- Professional ---                        Z86.010, Personal history of colonic polyps                        D12.2, Benign neoplasm of ascending colon CPT copyright 2022 American Medical Association. All rights reserved. The codes documented in this report are preliminary and upon coder review may  be revised to meet current compliance requirements. Jonathon Bellows, MD Jonathon Bellows MD, MD 09/07/2022 10:33:43  AM This report has been signed electronically. Number of Addenda: 0 Note Initiated On: 09/07/2022 10:06 AM Scope Withdrawal Time: 0 hours 12 minutes 8 seconds  Total Procedure Duration: 0 hours 14 minutes 40 seconds  Estimated Blood Loss:  Estimated blood loss: none.      [facility]

## 2022-09-07 NOTE — Anesthesia Procedure Notes (Signed)
Date/Time: 09/07/2022 10:29 AM  Performed by: Doreen Salvage, CRNAPre-anesthesia Checklist: Patient identified, Emergency Drugs available, Suction available and Patient being monitored Patient Re-evaluated:Patient Re-evaluated prior to induction Oxygen Delivery Method: Nasal cannula Induction Type: IV induction Dental Injury: Teeth and Oropharynx as per pre-operative assessment  Comments: Nasal cannula with etCO2 monitoring
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
## 2022-09-07 NOTE — Anesthesia Procedure Notes (Signed)
Date/Time: 09/07/2022 10:17 AM  Performed by: Doreen Salvage, CRNAPre-anesthesia Checklist: Patient identified, Emergency Drugs available, Suction available and Patient being monitored Patient Re-evaluated:Patient Re-evaluated prior to induction Oxygen Delivery Method: Nasal cannula Induction Type: IV induction Dental Injury: Teeth and Oropharynx as per pre-operative assessment  Comments: Nasal cannula with etCO2 monitoring

## 2022-09-10 ENCOUNTER — Encounter: Payer: Self-pay | Admitting: Gastroenterology

## 2022-09-10 LAB — SURGICAL PATHOLOGY

## 2022-09-13 ENCOUNTER — Encounter: Payer: Self-pay | Admitting: Gastroenterology

## 2022-09-27 NOTE — Anesthesia Postprocedure Evaluation (Signed)
Anesthesia Post Note  Patient: Ben Colyer  Procedure(s) Performed: COLONOSCOPY WITH PROPOFOL  Patient location during evaluation: Endoscopy Anesthesia Type: General Level of consciousness: awake and alert Pain management: pain level controlled Vital Signs Assessment: post-procedure vital signs reviewed and stable Respiratory status: spontaneous breathing, nonlabored ventilation, respiratory function stable and patient connected to nasal cannula oxygen Cardiovascular status: blood pressure returned to baseline and stable Postop Assessment: no apparent nausea or vomiting Anesthetic complications: no   No notable events documented.   Last Vitals:  Vitals:   09/07/22 1035 09/07/22 1045  BP: (!) 103/55 (!) 113/54  Pulse: 72 75  Resp: (!) 22 20  Temp:    SpO2: 100% 100%    Last Pain:  Vitals:   09/08/22 1140  TempSrc:   PainSc: 0-No pain                 Martha Clan
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
## 2022-10-12 ENCOUNTER — Ambulatory Visit (INDEPENDENT_AMBULATORY_CARE_PROVIDER_SITE_OTHER): Payer: Medicare Other | Admitting: Family Medicine

## 2022-10-12 ENCOUNTER — Encounter: Payer: Self-pay | Admitting: Family Medicine

## 2022-10-12 VITALS — BP 112/68 | HR 84 | Resp 16 | Ht 64.0 in | Wt 176.0 lb

## 2022-10-12 DIAGNOSIS — E1159 Type 2 diabetes mellitus with other circulatory complications: Secondary | ICD-10-CM

## 2022-10-12 DIAGNOSIS — E113213 Type 2 diabetes mellitus with mild nonproliferative diabetic retinopathy with macular edema, bilateral: Secondary | ICD-10-CM | POA: Diagnosis not present

## 2022-10-12 DIAGNOSIS — Z794 Long term (current) use of insulin: Secondary | ICD-10-CM

## 2022-10-12 DIAGNOSIS — E785 Hyperlipidemia, unspecified: Secondary | ICD-10-CM

## 2022-10-12 DIAGNOSIS — E1169 Type 2 diabetes mellitus with other specified complication: Secondary | ICD-10-CM | POA: Diagnosis not present

## 2022-10-12 DIAGNOSIS — F325 Major depressive disorder, single episode, in full remission: Secondary | ICD-10-CM | POA: Diagnosis not present

## 2022-10-12 DIAGNOSIS — I10 Essential (primary) hypertension: Secondary | ICD-10-CM

## 2022-10-12 DIAGNOSIS — E559 Vitamin D deficiency, unspecified: Secondary | ICD-10-CM

## 2022-10-12 DIAGNOSIS — E538 Deficiency of other specified B group vitamins: Secondary | ICD-10-CM

## 2022-10-12 DIAGNOSIS — I152 Hypertension secondary to endocrine disorders: Secondary | ICD-10-CM

## 2022-10-12 LAB — POCT GLYCOSYLATED HEMOGLOBIN (HGB A1C): Hemoglobin A1C: 8 % — AB (ref 4.0–5.6)

## 2022-10-12 MED ORDER — SOLIQUA 100-33 UNT-MCG/ML ~~LOC~~ SOPN: 45.0000 [IU] | PEN_INJECTOR | Freq: Every day | SUBCUTANEOUS | 0 refills | Status: DC

## 2022-10-12 MED ORDER — LOSARTAN POTASSIUM-HCTZ 50-12.5 MG PO TABS: 1.0000 | ORAL_TABLET | Freq: Every day | ORAL | 1 refills | Status: DC

## 2022-10-12 MED ORDER — METFORMIN HCL ER 750 MG PO TB24: 750.0000 mg | ORAL_TABLET | Freq: Two times a day (BID) | ORAL | 1 refills | Status: DC

## 2022-10-12 MED ORDER — ATORVASTATIN CALCIUM 40 MG PO TABS: 40.0000 mg | ORAL_TABLET | Freq: Every day | ORAL | 1 refills | Status: DC

## 2022-10-16 ENCOUNTER — Other Ambulatory Visit: Payer: Self-pay

## 2022-10-16 DIAGNOSIS — D649 Anemia, unspecified: Secondary | ICD-10-CM

## 2022-10-16 LAB — COMPLETE METABOLIC PANEL WITH GFR
AG Ratio: 1.6 (calc) (ref 1.0–2.5)
ALT: 12 U/L (ref 6–29)
AST: 12 U/L (ref 10–35)
Albumin: 4.1 g/dL (ref 3.6–5.1)
Alkaline phosphatase (APISO): 77 U/L (ref 37–153)
BUN: 19 mg/dL (ref 7–25)
CO2: 29 mmol/L (ref 20–32)
Calcium: 10 mg/dL (ref 8.6–10.4)
Chloride: 101 mmol/L (ref 98–110)
Creat: 0.8 mg/dL (ref 0.50–1.05)
Globulin: 2.5 g/dL (calc) (ref 1.9–3.7)
Glucose, Bld: 140 mg/dL — ABNORMAL HIGH (ref 65–99)
Potassium: 3.9 mmol/L (ref 3.5–5.3)
Sodium: 140 mmol/L (ref 135–146)
Total Bilirubin: 0.3 mg/dL (ref 0.2–1.2)
Total Protein: 6.6 g/dL (ref 6.1–8.1)
eGFR: 81 mL/min/{1.73_m2} (ref >=60)

## 2022-10-16 LAB — LIPID PANEL
Cholesterol: 120 mg/dL (ref <200)
HDL: 47 mg/dL — ABNORMAL LOW (ref >=50)
LDL Cholesterol (Calc): 56 mg/dL (calc)
Non-HDL Cholesterol (Calc): 73 mg/dL (calc) (ref <130)
Total CHOL/HDL Ratio: 2.6 (calc) (ref <5.0)
Triglycerides: 88 mg/dL (ref <150)

## 2022-10-16 LAB — CBC WITH DIFFERENTIAL/PLATELET
Absolute Monocytes: 431 cells/uL (ref 200–950)
Basophils Absolute: 30 cells/uL (ref 0–200)
Basophils Relative: 0.5 %
Eosinophils Absolute: 89 cells/uL (ref 15–500)
Eosinophils Relative: 1.5 %
HCT: 34.3 % — ABNORMAL LOW (ref 35.0–45.0)
Hemoglobin: 11.5 g/dL — ABNORMAL LOW (ref 11.7–15.5)
Lymphs Abs: 997 cells/uL (ref 850–3900)
MCH: 30.1 pg (ref 27.0–33.0)
MCHC: 33.5 g/dL (ref 32.0–36.0)
MCV: 89.8 fL (ref 80.0–100.0)
MPV: 10.3 fL (ref 7.5–12.5)
Monocytes Relative: 7.3 %
Neutro Abs: 4354 cells/uL (ref 1500–7800)
Neutrophils Relative %: 73.8 %
Platelets: 316 10*3/uL (ref 140–400)
RBC: 3.82 10*6/uL (ref 3.80–5.10)
RDW: 12.9 % (ref 11.0–15.0)
Total Lymphocyte: 16.9 %
WBC: 5.9 10*3/uL (ref 3.8–10.8)

## 2022-10-16 LAB — VITAMIN D 25 HYDROXY (VIT D DEFICIENCY, FRACTURES): Vit D, 25-Hydroxy: 48 ng/mL (ref 30–100)

## 2022-10-16 LAB — TEST AUTHORIZATION

## 2022-10-16 LAB — B12 AND FOLATE PANEL
Folate: 19.3 ng/mL
Vitamin B-12: 1186 pg/mL — ABNORMAL HIGH (ref 200–1100)

## 2022-10-16 LAB — IRON,TIBC AND FERRITIN PANEL
%SAT: 15 % (calc) — ABNORMAL LOW (ref 16–45)
Ferritin: 37 ng/mL (ref 16–288)
Iron: 57 ug/dL (ref 45–160)
TIBC: 391 mcg/dL (calc) (ref 250–450)

## 2022-10-25 ENCOUNTER — Other Ambulatory Visit: Payer: Self-pay | Admitting: Family Medicine

## 2022-10-30 ENCOUNTER — Other Ambulatory Visit: Payer: Self-pay | Admitting: Family Medicine

## 2022-10-30 DIAGNOSIS — E1169 Type 2 diabetes mellitus with other specified complication: Secondary | ICD-10-CM

## 2022-11-13 ENCOUNTER — Telehealth: Payer: Self-pay | Admitting: Family Medicine

## 2022-11-13 NOTE — Telephone Encounter (Signed)
Contacted Patricia Chan to schedule their annual wellness visit. Appointment made for 03/31/2020.  [facility] Care Guide [facility] AWV TEAM Direct Dial: [phone]

## 2022-11-23 ENCOUNTER — Ambulatory Visit (INDEPENDENT_AMBULATORY_CARE_PROVIDER_SITE_OTHER): Payer: Medicare Other | Admitting: Nurse Practitioner

## 2022-11-23 ENCOUNTER — Encounter: Payer: Self-pay | Admitting: Nurse Practitioner

## 2022-11-23 VITALS — BP 108/64 | HR 108 | Temp 98.0°F | Resp 18 | Ht 64.0 in | Wt 178.2 lb

## 2022-11-23 DIAGNOSIS — L509 Urticaria, unspecified: Secondary | ICD-10-CM | POA: Diagnosis not present

## 2022-11-23 MED ORDER — HYDROXYZINE PAMOATE 25 MG PO CAPS: 25.0000 mg | ORAL_CAPSULE | Freq: Three times a day (TID) | ORAL | 0 refills | Status: DC | PRN

## 2022-11-23 MED ORDER — TRIAMCINOLONE ACETONIDE 0.5 % EX OINT: 1.0000 | TOPICAL_OINTMENT | Freq: Two times a day (BID) | CUTANEOUS | 0 refills | Status: AC

## 2022-11-23 MED ORDER — FAMOTIDINE 20 MG PO TABS: 20.0000 mg | ORAL_TABLET | Freq: Two times a day (BID) | ORAL | 0 refills | Status: DC

## 2022-11-23 NOTE — Progress Notes (Signed)
BP 108/64   Pulse (!) 108   Temp 98 F (36.7 C)   Resp 18   Ht  (1.626 m)   Wt 178 lb 3.2 oz (80.8 kg)   SpO2 97%   BMI 30.59 kg/m    Subjective:    Patient ID: Patricia Chan, female    DOB: [birth year], 67 y.o.   MRN: [id number]  HPI: Patricia Chan is a 67 y.o. female  Chief Complaint  Patient presents with   Rash    With itching    Rash:  she reports she has been having rash with itching last week.  She says she is getting whelps on different parts of her body she says that she is not on any new medications, soaps or detergents.  Says she thought it was because of her insulin however patient has been on this insulin for years.  She says that she has been using witch hazel and alcohol.  Will give her hydroxyzine and start pepcid. She is currently taking Claritin.  She declined steroid taper.  Also send in Kenalog cream.  Patient is established with allergist recommend she follow-up with them for further testing.  Relevant past medical, surgical, family and social history reviewed and updated as indicated. Interim medical history since our last visit reviewed. Allergies and medications reviewed and updated.  Review of Systems  Constitutional: Negative for fever or weight change.  Respiratory: Negative for cough and shortness of breath.   Cardiovascular: Negative for chest pain or palpitations.  Gastrointestinal: Negative for abdominal pain, no bowel changes.  Musculoskeletal: Negative for gait problem or joint swelling.  Skin: positive for hives  Neurological: Negative for dizziness or headache.  No other specific complaints in a complete review of systems (except as listed in HPI above).      Objective:    BP 108/64   Pulse (!) 108   Temp 98 F (36.7 C)   Resp 18   Ht  (1.626 m)   Wt 178 lb 3.2 oz (80.8 kg)   SpO2 97%   BMI 30.59 kg/m   Wt Readings from Last 3 Encounters:  11/23/22 178 lb 3.2 oz (80.8 kg)  10/12/22 176 lb (79.8 kg)   09/07/22 168 lb 12.8 oz (76.6 kg)    Physical Exam  Constitutional: Patient appears well-developed and well-nourished. Obese  No distress.  HEENT: head atraumatic, normocephalic, pupils equal and reactive to light, neck supple Cardiovascular: Normal rate, regular rhythm and normal heart sounds.  No murmur heard. No BLE edema. Pulmonary/Chest: Effort normal and breath sounds normal. No respiratory distress. Abdominal: Soft.  There is no tenderness. Skin : hives on bilateral axilla, and abdomen Psychiatric: Patient has a normal mood and affect. behavior is normal. Judgment and thought content normal.  Results for orders placed or performed in visit on 10/12/22  COMPLETE METABOLIC PANEL WITH GFR  Result Value Ref Range   Glucose, Bld 140 (H) 65 - 99 mg/dL   BUN 19 7 - 25 mg/dL   Creat 4.09 8.11 - 9.14 mg/dL   eGFR 81 > OR = 60 NW/GNF/6.21H0   BUN/Creatinine Ratio SEE NOTE: 6 - 22 (calc)   Sodium 140 135 - 146 mmol/L   Potassium 3.9 3.5 - 5.3 mmol/L   Chloride 101 98 - 110 mmol/L   CO2 29 20 - 32 mmol/L   Calcium 10.0 8.6 - 10.4 mg/dL   Total Protein 6.6 6.1 - 8.1 g/dL   Albumin 4.1 3.6 -  5.1 g/dL   Globulin 2.5 1.9 - 3.7 g/dL (calc)   AG Ratio 1.6 1.0 - 2.5 (calc)   Total Bilirubin 0.3 0.2 - 1.2 mg/dL   Alkaline phosphatase (APISO) 77 37 - 153 U/L   AST 12 10 - 35 U/L   ALT 12 6 - 29 U/L  Lipid panel  Result Value Ref Range   Cholesterol 120 <200 mg/dL   HDL 47 (L) > OR = 50 mg/dL   Triglycerides 88 <098 mg/dL   LDL Cholesterol (Calc) 56 mg/dL (calc)   Total CHOL/HDL Ratio 2.6 <5.0 (calc)   Non-HDL Cholesterol (Calc) 73 <119 mg/dL (calc)  CBC with Differential/Platelet  Result Value Ref Range   WBC 5.9 3.8 - 10.8 Thousand/uL   RBC 3.82 3.80 - 5.10 Million/uL   Hemoglobin 11.5 (L) 11.7 - 15.5 g/dL   HCT 14.7 (L) 82.9 - 56.2 %   MCV 89.8 80.0 - 100.0 fL   MCH 30.1 27.0 - 33.0 pg   MCHC 33.5 32.0 - 36.0 g/dL   RDW 13.0 86.5 - 78.4 %   Platelets 316 140 - 400 Thousand/uL    MPV 10.3 7.5 - 12.5 fL   Neutro Abs 4,354 1,500 - 7,800 cells/uL   Lymphs Abs 997 850 - 3,900 cells/uL   Absolute Monocytes 431 200 - 950 cells/uL   Eosinophils Absolute 89 15 - 500 cells/uL   Basophils Absolute 30 0 - 200 cells/uL   Neutrophils Relative % 73.8 %   Total Lymphocyte 16.9 %   Monocytes Relative 7.3 %   Eosinophils Relative 1.5 %   Basophils Relative 0.5 %  VITAMIN D 25 Hydroxy (Vit-D Deficiency, Fractures)  Result Value Ref Range   Vit D, 25-Hydroxy 48 30 - 100 ng/mL  B12 and Folate Panel  Result Value Ref Range   Vitamin B-12 1,186 (H) 200 - 1,100 pg/mL   Folate 19.3 ng/mL  Iron, TIBC and Ferritin Panel  Result Value Ref Range   Iron 57 45 - 160 mcg/dL   TIBC 696 295 - 284 mcg/dL (calc)   %SAT 15 (L) 16 - 45 % (calc)   Ferritin 37 16 - 288 ng/mL  TEST AUTHORIZATION  Result Value Ref Range   TEST NAME: IRON, TIBC AND FERRITIN PANEL    TEST CODE: 5616XLL3    CLIENT CONTACT: SOWLES    REPORT ALWAYS MESSAGE SIGNATURE    POCT HgB A1C  Result Value Ref Range   Hemoglobin A1C 8.0 (A) 4.0 - 5.6 %   HbA1c POC (<> result, manual entry)     HbA1c, POC (prediabetic range)     HbA1c, POC (controlled diabetic range)        Assessment & Plan:   Problem List Items Addressed This Visit   None Visit Diagnoses     Urticaria    -  Primary   Start Kenalog cream, hydroxyzine, Pepcid and continue Claritin.  Follow-up with allergist.   Relevant Medications   famotidine (PEPCID) 20 MG tablet   triamcinolone ointment (KENALOG) 0.5 %   hydrOXYzine (VISTARIL) 25 MG capsule        Follow up plan: Return if symptoms worsen or fail to improve.

## 2022-12-19 NOTE — Progress Notes (Unsigned)
There were no vitals taken for this visit.   Subjective:    Patient ID: Patricia Chan, female    DOB: 1956-07-03, 67 y.o.   MRN: [id number]  HPI: Patricia Chan is a 67 y.o. female  No chief complaint on file.   Relevant past medical, surgical, family and social history reviewed and updated as indicated. Interim medical history since our last visit reviewed. Allergies and medications reviewed and updated.  Review of Systems  Constitutional: Negative for fever or weight change.  Respiratory: Negative for cough and shortness of breath.   Cardiovascular: Negative for chest pain or palpitations.  Gastrointestinal: Negative for abdominal pain, no bowel changes.  Musculoskeletal: Negative for gait problem or joint swelling.  Skin: Negative for rash.  Neurological: Negative for dizziness or headache.  No other specific complaints in a complete review of systems (except as listed in HPI above).      Objective:    There were no vitals taken for this visit.  Wt Readings from Last 3 Encounters:  11/23/22 178 lb 3.2 oz (80.8 kg)  10/12/22 176 lb (79.8 kg)  09/07/22 168 lb 12.8 oz (76.6 kg)    Physical Exam  Constitutional: Patient appears well-developed and well-nourished. Obese *** No distress.  HEENT: head atraumatic, normocephalic, pupils equal and reactive to light, ears ***, neck supple, throat within normal limits Cardiovascular: Normal rate, regular rhythm and normal heart sounds.  No murmur heard. No BLE edema. Pulmonary/Chest: Effort normal and breath sounds normal. No respiratory distress. Abdominal: Soft.  There is no tenderness. Psychiatric: Patient has a normal mood and affect. behavior is normal. Judgment and thought content normal.  Results for orders placed or performed in visit on 10/12/22  COMPLETE METABOLIC PANEL WITH GFR  Result Value Ref Range   Glucose, Bld 140 (H) 65 - 99 mg/dL   BUN 19 7 - 25 mg/dL   Creat 5.78 4.69 - 6.29 mg/dL   eGFR 81 >  OR = 60 BM/WUX/3.24M0   BUN/Creatinine Ratio SEE NOTE: 6 - 22 (calc)   Sodium 140 135 - 146 mmol/L   Potassium 3.9 3.5 - 5.3 mmol/L   Chloride 101 98 - 110 mmol/L   CO2 29 20 - 32 mmol/L   Calcium 10.0 8.6 - 10.4 mg/dL   Total Protein 6.6 6.1 - 8.1 g/dL   Albumin 4.1 3.6 - 5.1 g/dL   Globulin 2.5 1.9 - 3.7 g/dL (calc)   AG Ratio 1.6 1.0 - 2.5 (calc)   Total Bilirubin 0.3 0.2 - 1.2 mg/dL   Alkaline phosphatase (APISO) 77 37 - 153 U/L   AST 12 10 - 35 U/L   ALT 12 6 - 29 U/L  Lipid panel  Result Value Ref Range   Cholesterol 120 <200 mg/dL   HDL 47 (L) > OR = 50 mg/dL   Triglycerides 88 <102 mg/dL   LDL Cholesterol (Calc) 56 mg/dL (calc)   Total CHOL/HDL Ratio 2.6 <5.0 (calc)   Non-HDL Cholesterol (Calc) 73 <725 mg/dL (calc)  CBC with Differential/Platelet  Result Value Ref Range   WBC 5.9 3.8 - 10.8 Thousand/uL   RBC 3.82 3.80 - 5.10 Million/uL   Hemoglobin 11.5 (L) 11.7 - 15.5 g/dL   HCT 36.6 (L) 44.0 - 34.7 %   MCV 89.8 80.0 - 100.0 fL   MCH 30.1 27.0 - 33.0 pg   MCHC 33.5 32.0 - 36.0 g/dL   RDW 42.5 95.6 - 38.7 %   Platelets 316 140 - 400 Thousand/uL  MPV 10.3 7.5 - 12.5 fL   Neutro Abs 4,354 1,500 - 7,800 cells/uL   Lymphs Abs 997 850 - 3,900 cells/uL   Absolute Monocytes 431 200 - 950 cells/uL   Eosinophils Absolute 89 15 - 500 cells/uL   Basophils Absolute 30 0 - 200 cells/uL   Neutrophils Relative % 73.8 %   Total Lymphocyte 16.9 %   Monocytes Relative 7.3 %   Eosinophils Relative 1.5 %   Basophils Relative 0.5 %  VITAMIN D 25 Hydroxy (Vit-D Deficiency, Fractures)  Result Value Ref Range   Vit D, 25-Hydroxy 48 30 - 100 ng/mL  B12 and Folate Panel  Result Value Ref Range   Vitamin B-12 1,186 (H) 200 - 1,100 pg/mL   Folate 19.3 ng/mL  Iron, TIBC and Ferritin Panel  Result Value Ref Range   Iron 57 45 - 160 mcg/dL   TIBC 161 096 - 045 mcg/dL (calc)   %SAT 15 (L) 16 - 45 % (calc)   Ferritin 37 16 - 288 ng/mL  TEST AUTHORIZATION  Result Value Ref Range    TEST NAME: IRON, TIBC AND FERRITIN PANEL    TEST CODE: [id number]    CLIENT CONTACT: SOWLES    REPORT ALWAYS MESSAGE SIGNATURE    POCT HgB A1C  Result Value Ref Range   Hemoglobin A1C 8.0 (A) 4.0 - 5.6 %   HbA1c POC (<> result, manual entry)     HbA1c, POC (prediabetic range)     HbA1c, POC (controlled diabetic range)        Assessment & Plan:   Problem List Items Addressed This Visit   None    Follow up plan: No follow-ups on file.

## 2022-12-20 ENCOUNTER — Other Ambulatory Visit: Payer: Self-pay

## 2022-12-20 ENCOUNTER — Ambulatory Visit
Admission: RE | Admit: 2022-12-20 | Discharge: 2022-12-20 | Disposition: A | Discharge status: Home / Self Care | Payer: Medicare Other | Attending: Nurse Practitioner | Admitting: Nurse Practitioner

## 2022-12-20 ENCOUNTER — Ambulatory Visit (INDEPENDENT_AMBULATORY_CARE_PROVIDER_SITE_OTHER): Payer: Medicare Other | Admitting: Nurse Practitioner

## 2022-12-20 ENCOUNTER — Ambulatory Visit
Admission: RE | Admit: 2022-12-20 | Discharge: 2022-12-20 | Disposition: A | Discharge status: Home / Self Care | Payer: Medicare Other | Source: Ambulatory Visit | Attending: Nurse Practitioner | Admitting: Nurse Practitioner

## 2022-12-20 ENCOUNTER — Encounter: Payer: Self-pay | Admitting: Nurse Practitioner

## 2022-12-20 VITALS — BP 120/70 | HR 95 | Temp 98.2°F | Resp 16 | Ht 64.0 in | Wt 182.2 lb

## 2022-12-20 DIAGNOSIS — M5440 Lumbago with sciatica, unspecified side: Secondary | ICD-10-CM | POA: Diagnosis not present

## 2022-12-20 MED ORDER — METHOCARBAMOL 500 MG PO TABS
500.0000 mg | ORAL_TABLET | Freq: Two times a day (BID) | ORAL | 0 refills | Status: DC | PRN
Start: 2022-12-20 — End: 2023-07-10

## 2022-12-20 MED ORDER — NAPROXEN 500 MG PO TABS
500.0000 mg | ORAL_TABLET | Freq: Two times a day (BID) | ORAL | 0 refills | Status: DC
Start: 2022-12-20 — End: 2023-02-05

## 2023-01-03 ENCOUNTER — Telehealth: Payer: Self-pay | Admitting: Nurse Practitioner

## 2023-01-03 NOTE — Telephone Encounter (Signed)
Copied from CRM [id number]. Topic: General - Other >> Jan 03, 2023  4:16 PM Patricia Chan wrote: Reason for CRM: Pt is calling in because she is requesting a referral for physical therapy. Pt would like a callback when the referral has been made.

## 2023-01-04 ENCOUNTER — Other Ambulatory Visit: Payer: Self-pay | Admitting: Nurse Practitioner

## 2023-01-04 DIAGNOSIS — M5440 Lumbago with sciatica, unspecified side: Secondary | ICD-10-CM

## 2023-01-04 NOTE — Telephone Encounter (Signed)
Patient notified

## 2023-01-04 NOTE — Telephone Encounter (Signed)
FYI

## 2023-01-11 LAB — HM DIABETES EYE EXAM

## 2023-01-14 ENCOUNTER — Telehealth: Payer: Self-pay | Admitting: Family Medicine

## 2023-01-14 NOTE — Telephone Encounter (Unsigned)
Copied from CRM [id number]. Topic: Referral - Status >> Jan 14, 2023  4:11 PM Dondra Prader A wrote: Reason for CRM: Pt went to [facility] where she was referred to on 01/04/23, pt was told that they never received the referral, in they system it is showing that it was sent to them on 01/04/23. Could you please re send the referral to [facility] for the pt.

## 2023-01-15 NOTE — Telephone Encounter (Signed)
Pt informed that the referral has been resent

## 2023-02-04 ENCOUNTER — Other Ambulatory Visit: Payer: Self-pay | Admitting: Nurse Practitioner

## 2023-02-04 ENCOUNTER — Telehealth: Payer: Self-pay | Admitting: Family Medicine

## 2023-02-04 DIAGNOSIS — M5440 Lumbago with sciatica, unspecified side: Secondary | ICD-10-CM

## 2023-02-04 NOTE — Telephone Encounter (Signed)
Medication Refill - Medication: naproxen (NAPROSYN) 500 MG tablet [[id number]]   Has the patient contacted their pharmacy? Yes.    [id number] [place] [place] - [address] [place] [address] Phone: [phone]  Fax: [phone]      Has the patient been seen for an appointment in the last year OR does the patient have an upcoming appointment? Yes.    Agent: Please be advised that RX refills may take up to 3 business days. We ask that you follow-up with your pharmacy.

## 2023-02-04 NOTE — Telephone Encounter (Signed)
Pharmacy already requested this medication on 02/04/23 refill encounter, this is a duplicate request.

## 2023-02-05 NOTE — Telephone Encounter (Signed)
Requested Prescriptions  Pending Prescriptions Disp Refills   naproxen (NAPROSYN) 500 MG tablet [Pharmacy Med Name: NAPROXEN 500 MG TABLET] 30 tablet 0    Sig: TAKE 1 TABLET BY MOUTH 2 TIMES DAILY WITH A MEAL.     Analgesics:  NSAIDS Failed - 02/04/2023  8:32 AM      Failed - Manual Review: Labs are only required if the patient has taken medication for more than 8 weeks.      Failed - HGB in normal range and within 360 days    Hemoglobin  Date Value Ref Range Status  10/12/2022 11.5 (L) 11.7 - 15.5 g/dL Final         Failed - HCT in normal range and within 360 days    HCT  Date Value Ref Range Status  10/12/2022 34.3 (L) 35.0 - 45.0 % Final         Passed - Cr in normal range and within 360 days    Creat  Date Value Ref Range Status  10/12/2022 0.80 0.50 - 1.05 mg/dL Final   Creatinine, Urine  Date Value Ref Range Status  02/07/2022 79 20 - 275 mg/dL Final         Passed - PLT in normal range and within 360 days    Platelets  Date Value Ref Range Status  10/12/2022 316 140 - 400 Thousand/uL Final         Passed - eGFR is 30 or above and within 360 days    GFR, Est African American  Date Value Ref Range Status  12/23/2020 95 > OR = 60 mL/min/1.59m2 Final   GFR, Est Non African American  Date Value Ref Range Status  12/23/2020 82 > OR = 60 mL/min/1.56m2 Final   eGFR  Date Value Ref Range Status  10/12/2022 81 > OR = 60 mL/min/1.85m2 Final         Passed - Patient is not pregnant      Passed - Valid encounter within last 12 months    Recent Outpatient Visits           1 month ago Acute bilateral low back pain with sciatica, sciatica laterality unspecified   [facility] Berniece Salines, FNP   2 months ago Urticaria   [facility] Della Goo F, FNP   3 months ago Dyslipidemia associated with type 2 diabetes mellitus [facility])   [facility] Alba Cory, MD   7 months ago Rash    [facility] [place], Danna Hefty, MD   7 months ago Dyslipidemia associated with type 2 diabetes mellitus [facility])   [place] Alba Cory, MD       Future Appointments             In 4 weeks Alba Cory, MD [facility], [facility]

## 2023-02-11 ENCOUNTER — Ambulatory Visit: Payer: Medicare Other | Admitting: Family Medicine

## 2023-03-02 ENCOUNTER — Other Ambulatory Visit: Payer: Self-pay | Admitting: Family Medicine

## 2023-03-02 DIAGNOSIS — M5440 Lumbago with sciatica, unspecified side: Secondary | ICD-10-CM

## 2023-03-04 ENCOUNTER — Other Ambulatory Visit: Payer: Self-pay

## 2023-03-04 DIAGNOSIS — M5440 Lumbago with sciatica, unspecified side: Secondary | ICD-10-CM
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
## 2023-03-06 ENCOUNTER — Ambulatory Visit (INDEPENDENT_AMBULATORY_CARE_PROVIDER_SITE_OTHER): Payer: Medicare Other | Admitting: Family Medicine

## 2023-03-06 ENCOUNTER — Encounter: Payer: Self-pay | Admitting: Family Medicine

## 2023-03-06 VITALS — BP 114/72 | HR 98 | Temp 97.8°F | Resp 16 | Ht 64.0 in | Wt 182.1 lb

## 2023-03-06 DIAGNOSIS — E113213 Type 2 diabetes mellitus with mild nonproliferative diabetic retinopathy with macular edema, bilateral: Secondary | ICD-10-CM | POA: Diagnosis not present

## 2023-03-06 DIAGNOSIS — E1169 Type 2 diabetes mellitus with other specified complication: Secondary | ICD-10-CM

## 2023-03-06 DIAGNOSIS — Z794 Long term (current) use of insulin: Secondary | ICD-10-CM

## 2023-03-06 DIAGNOSIS — D649 Anemia, unspecified: Secondary | ICD-10-CM | POA: Diagnosis not present

## 2023-03-06 DIAGNOSIS — E1159 Type 2 diabetes mellitus with other circulatory complications: Secondary | ICD-10-CM

## 2023-03-06 DIAGNOSIS — E538 Deficiency of other specified B group vitamins: Secondary | ICD-10-CM | POA: Diagnosis not present

## 2023-03-06 DIAGNOSIS — I152 Hypertension secondary to endocrine disorders: Secondary | ICD-10-CM

## 2023-03-06 DIAGNOSIS — F325 Major depressive disorder, single episode, in full remission: Secondary | ICD-10-CM | POA: Diagnosis not present

## 2023-03-06 DIAGNOSIS — E785 Hyperlipidemia, unspecified: Secondary | ICD-10-CM

## 2023-03-06 LAB — POCT GLYCOSYLATED HEMOGLOBIN (HGB A1C): Hemoglobin A1C: 8.1 % — AB (ref 4.0–5.6)

## 2023-03-06 LAB — CBC WITH DIFFERENTIAL/PLATELET
Absolute Monocytes: 532 cells/uL (ref 200–950)
Basophils Absolute: 28 cells/uL (ref 0–200)
Basophils Relative: 0.4 %
Eosinophils Absolute: 210 cells/uL (ref 15–500)
Eosinophils Relative: 3 %
HCT: 35.6 % (ref 35.0–45.0)
Hemoglobin: 12 g/dL (ref 11.7–15.5)
Lymphs Abs: 1407 cells/uL (ref 850–3900)
MCH: 29.9 pg (ref 27.0–33.0)
MCHC: 33.7 g/dL (ref 32.0–36.0)
MCV: 88.6 fL (ref 80.0–100.0)
MPV: 10.1 fL (ref 7.5–12.5)
Monocytes Relative: 7.6 %
Neutro Abs: 4823 cells/uL (ref 1500–7800)
Neutrophils Relative %: 68.9 %
Platelets: 341 10*3/uL (ref 140–400)
RBC: 4.02 10*6/uL (ref 3.80–5.10)
RDW: 13.1 % (ref 11.0–15.0)
Total Lymphocyte: 20.1 %
WBC: 7 10*3/uL (ref 3.8–10.8)

## 2023-03-06 MED ORDER — HYDROCHLOROTHIAZIDE 12.5 MG PO TABS
12.5000 mg | ORAL_TABLET | Freq: Every day | ORAL | 0 refills | Status: DC
Start: 2023-03-06 — End: 2023-07-10

## 2023-03-12 ENCOUNTER — Encounter: Payer: Medicare Other | Admitting: Family Medicine

## 2023-04-21 ENCOUNTER — Other Ambulatory Visit: Payer: Self-pay | Admitting: Family Medicine

## 2023-04-21 DIAGNOSIS — R232 Flushing: Secondary | ICD-10-CM

## 2023-04-21 DIAGNOSIS — F325 Major depressive disorder, single episode, in full remission: Secondary | ICD-10-CM

## 2023-05-02 ENCOUNTER — Other Ambulatory Visit: Payer: Self-pay | Admitting: Family Medicine

## 2023-05-02 DIAGNOSIS — E113213 Type 2 diabetes mellitus with mild nonproliferative diabetic retinopathy with macular edema, bilateral: Secondary | ICD-10-CM

## 2023-05-02 NOTE — Telephone Encounter (Signed)
Pt called saying she just needles "the short needles for the insulin" .   She did not know the name .  [facility]   CB@  [phone]

## 2023-05-03 MED ORDER — NOVOFINE AUTOCOVER 30G X 8 MM MISC: 1.0000 | 1 refills | Status: AC | PRN

## 2023-05-03 NOTE — Telephone Encounter (Signed)
Requested Prescriptions  Pending Prescriptions Disp Refills   Insulin Pen Needle (NOVOFINE AUTOCOVER) 30G X 8 MM MISC 100 each 1    Sig: Inject 10 each into the skin as needed.     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, with long-term current use of insulin [facility])   [facility] Alba Cory, MD   4 months ago Acute bilateral low back pain with sciatica, sciatica laterality unspecified   [facility] Berniece Salines, FNP   5 months ago Urticaria   [facility] Della Goo F, FNP   6 months ago Dyslipidemia associated with type 2 diabetes mellitus [facility])   [facility] Alba Cory, MD   10 months ago Rash   [facility] Alba Cory, MD       Future Appointments             In 2 months Carlynn Purl, Danna Hefty, MD [facility], [facility]

## 2023-05-14 ENCOUNTER — Other Ambulatory Visit: Payer: Self-pay | Admitting: Family Medicine

## 2023-05-14 DIAGNOSIS — Z1231 Encounter for screening mammogram for malignant neoplasm of breast: Secondary | ICD-10-CM

## 2023-05-16 ENCOUNTER — Other Ambulatory Visit: Payer: Self-pay | Admitting: Family Medicine

## 2023-05-16 DIAGNOSIS — E1169 Type 2 diabetes mellitus with other specified complication: Secondary | ICD-10-CM

## 2023-05-23 ENCOUNTER — Other Ambulatory Visit: Payer: Self-pay | Admitting: Family Medicine

## 2023-05-23 DIAGNOSIS — E1169 Type 2 diabetes mellitus with other specified complication: Secondary | ICD-10-CM

## 2023-05-23 NOTE — Telephone Encounter (Signed)
Requested Prescriptions  Pending Prescriptions Disp Refills   pioglitazone (ACTOS) 15 MG tablet [Pharmacy Med Name: PIOGLITAZONE HCL 15 MG TABLET] 90 tablet 1    Sig: TAKE 1 TABLET (15 MG TOTAL) BY MOUTH DAILY.     Endocrinology:  Diabetes - Glitazones - pioglitazone Failed - 05/23/2023  5:33 PM      Failed - HBA1C is between 0 and 7.9 and within 180 days    Hemoglobin A1C  Date Value Ref Range Status  03/06/2023 8.1 (A) 4.0 - 5.6 % Final   HbA1c, POC (controlled diabetic range)  Date Value Ref Range Status  02/11/2019 6.7 0.0 - 7.0 % Final   Hgb A1c MFr Bld  Date Value Ref Range Status  10/10/2021 8.7 (H) <5.7 % of total Hgb Final    Comment:    For someone without known diabetes, a hemoglobin A1c value of 6.5% or greater indicates that they may have  diabetes and this should be confirmed with a follow-up  test. . For someone with known diabetes, a value <7% indicates  that their diabetes is well controlled and a value  greater than or equal to 7% indicates suboptimal  control. A1c targets should be individualized based on  duration of diabetes, age, comorbid conditions, and  other considerations. . Currently, no consensus exists regarding use of hemoglobin A1c for diagnosis of diabetes for children. 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, with long-term current use of insulin [facility])   [facility] Alba Cory, MD   5 months ago Acute bilateral low back pain with sciatica, sciatica laterality unspecified   [facility] Berniece Salines, FNP   6 months ago Urticaria   [facility] Della Goo F, FNP   7 months ago Dyslipidemia associated with type 2 diabetes mellitus [facility])   [facility]  Alba Cory, MD   11 months ago Rash   [facility] Alba Cory, MD       Future Appointments             In 1 month Carlynn Purl, Danna Hefty, MD [facility], [facility]

## 2023-05-31 ENCOUNTER — Ambulatory Visit (INDEPENDENT_AMBULATORY_CARE_PROVIDER_SITE_OTHER): Payer: Medicare Other

## 2023-05-31 VITALS — Ht 64.0 in | Wt 177.0 lb

## 2023-05-31 DIAGNOSIS — Z0000 Encounter for general adult medical examination without abnormal findings: Secondary | ICD-10-CM | POA: Diagnosis not present
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
## 2023-05-31 NOTE — Patient Instructions (Signed)
Patricia Chan , Thank you for taking time to come for your Medicare Wellness Visit. I appreciate your ongoing commitment to your health goals. Please review the following plan we discussed and let me know if I can assist you in the future.   Referrals/Orders/Follow-Ups/Clinician Recommendations: none  This is a list of the screening recommended for you and due dates:  Health Maintenance  Topic Date Due   Complete foot exam   06/13/2023   COVID-19 Vaccine (7 - 2023-24 season) 07/04/2023   Mammogram  08/11/2023   Hemoglobin A1C  09/06/2023   Yearly kidney function blood test for diabetes  10/12/2023   Eye exam for diabetics  01/11/2024   Yearly kidney health urinalysis for diabetes  03/05/2024   Medicare Annual Wellness Visit  05/30/2024   Pneumonia Vaccine (3 of 3 - PPSV23 or PCV20) 03/29/2025   Colon Cancer Screening  09/07/2029   DTaP/Tdap/Td vaccine (3 - Td or Tdap) 03/29/2030   Flu Shot  Completed   DEXA scan (bone density measurement)  Completed   Hepatitis C Screening  Completed   Zoster (Shingles) Vaccine  Completed   HPV Vaccine  Aged Out    Advanced directives: (Copy Requested) Please bring a copy of your health care power of attorney and living will to the office to be added to your chart at your convenience.  Next Medicare Annual Wellness Visit scheduled for next year: Yes 06/11/2024 @ 1pm telephone
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
## 2023-07-10 ENCOUNTER — Ambulatory Visit (INDEPENDENT_AMBULATORY_CARE_PROVIDER_SITE_OTHER): Payer: Medicare Other | Admitting: Family Medicine

## 2023-07-10 ENCOUNTER — Encounter: Payer: Self-pay | Admitting: Family Medicine

## 2023-07-10 VITALS — BP 132/74 | HR 97 | Temp 97.5°F | Resp 18 | Ht 64.0 in | Wt 173.6 lb

## 2023-07-10 DIAGNOSIS — L409 Psoriasis, unspecified: Secondary | ICD-10-CM | POA: Insufficient documentation

## 2023-07-10 DIAGNOSIS — I10 Essential (primary) hypertension: Secondary | ICD-10-CM

## 2023-07-10 DIAGNOSIS — E785 Hyperlipidemia, unspecified: Secondary | ICD-10-CM

## 2023-07-10 DIAGNOSIS — E1169 Type 2 diabetes mellitus with other specified complication: Secondary | ICD-10-CM

## 2023-07-10 DIAGNOSIS — Z794 Long term (current) use of insulin: Secondary | ICD-10-CM

## 2023-07-10 DIAGNOSIS — F325 Major depressive disorder, single episode, in full remission: Secondary | ICD-10-CM

## 2023-07-10 DIAGNOSIS — M170 Bilateral primary osteoarthritis of knee: Secondary | ICD-10-CM | POA: Insufficient documentation

## 2023-07-10 DIAGNOSIS — E538 Deficiency of other specified B group vitamins: Secondary | ICD-10-CM

## 2023-07-10 DIAGNOSIS — E113213 Type 2 diabetes mellitus with mild nonproliferative diabetic retinopathy with macular edema, bilateral: Secondary | ICD-10-CM

## 2023-07-10 LAB — POCT GLYCOSYLATED HEMOGLOBIN (HGB A1C): Hemoglobin A1C: 7.6 % — AB (ref 4.0–5.6)

## 2023-07-10 MED ORDER — OTEZLA 30 MG PO TABS: 1.0000 | ORAL_TABLET | Freq: Every day | ORAL | 0 refills | Status: DC

## 2023-07-10 MED ORDER — HYDROCHLOROTHIAZIDE 12.5 MG PO TABS: 12.5000 mg | ORAL_TABLET | Freq: Every day | ORAL | 1 refills | Status: DC

## 2023-07-10 MED ORDER — METFORMIN HCL ER 750 MG PO TB24: 750.0000 mg | ORAL_TABLET | Freq: Two times a day (BID) | ORAL | 1 refills | Status: DC

## 2023-07-10 MED ORDER — ATORVASTATIN CALCIUM 40 MG PO TABS: 40.0000 mg | ORAL_TABLET | Freq: Every day | ORAL | 1 refills | Status: DC

## 2023-07-11 ENCOUNTER — Other Ambulatory Visit: Payer: Self-pay

## 2023-07-11 ENCOUNTER — Telehealth: Payer: Self-pay | Admitting: Family Medicine

## 2023-07-11 NOTE — Telephone Encounter (Signed)
Called pt back and asked about ACTOS since PCP has in file only to take once daily. Pt stated she verbalized to previous nurse about OTEZLA 30mg  not ACTOS. I have added OTEZLA and IVLZLV to med list

## 2023-07-11 NOTE — Telephone Encounter (Signed)
General/Other -   Pt called in and wanted to add the following medications to her list (she was seen in office yesterday and didn't have) : pioglitazone (ACTOS) 15 MG tablet (pt states 30 mg but it shows in medication listing as 15 mg) also the eye drops that she is using is IVLZLV 0.4 mg).

## 2023-07-26 ENCOUNTER — Other Ambulatory Visit: Payer: Self-pay | Admitting: Family Medicine

## 2023-07-26 DIAGNOSIS — I10 Essential (primary) hypertension: Secondary | ICD-10-CM

## 2023-07-26 DIAGNOSIS — Z794 Long term (current) use of insulin: Secondary | ICD-10-CM

## 2023-08-02 ENCOUNTER — Other Ambulatory Visit: Payer: Self-pay | Admitting: Family Medicine

## 2023-08-02 DIAGNOSIS — I10 Essential (primary) hypertension: Secondary | ICD-10-CM

## 2023-08-02 DIAGNOSIS — Z794 Long term (current) use of insulin: Secondary | ICD-10-CM

## 2023-08-15 ENCOUNTER — Ambulatory Visit
Admission: RE | Admit: 2023-08-15 | Discharge: 2023-08-15 | Disposition: A | Discharge status: Home / Self Care | Payer: Medicare Other | Source: Ambulatory Visit | Attending: Family Medicine | Admitting: Family Medicine

## 2023-08-15 DIAGNOSIS — J019 Acute sinusitis, unspecified: Secondary | ICD-10-CM | POA: Diagnosis not present

## 2023-08-15 DIAGNOSIS — Z1231 Encounter for screening mammogram for malignant neoplasm of breast: Secondary | ICD-10-CM | POA: Insufficient documentation

## 2023-08-15 DIAGNOSIS — L508 Other urticaria: Secondary | ICD-10-CM | POA: Diagnosis not present

## 2023-08-21 DIAGNOSIS — M5414 Radiculopathy, thoracic region: Secondary | ICD-10-CM | POA: Diagnosis not present

## 2023-08-21 DIAGNOSIS — M9901 Segmental and somatic dysfunction of cervical region: Secondary | ICD-10-CM | POA: Diagnosis not present

## 2023-08-21 DIAGNOSIS — M9902 Segmental and somatic dysfunction of thoracic region: Secondary | ICD-10-CM | POA: Diagnosis not present

## 2023-08-21 DIAGNOSIS — M62830 Muscle spasm of back: Secondary | ICD-10-CM | POA: Diagnosis not present

## 2023-09-05 DIAGNOSIS — L400 Psoriasis vulgaris: Secondary | ICD-10-CM | POA: Diagnosis not present

## 2023-09-05 DIAGNOSIS — D2371 Other benign neoplasm of skin of right lower limb, including hip: Secondary | ICD-10-CM | POA: Diagnosis not present

## 2023-09-06 ENCOUNTER — Telehealth: Payer: Self-pay

## 2023-09-06 NOTE — Progress Notes (Signed)
Care Guide Pharmacy Note  09/06/2023 Name: [name] MRN: [id number] DOB: 1956-03-06  Referred By: Alba Cory, MD Reason for referral: Care Coordination ([facility] Diabetes. )   Patricia Chan is a 68 y.o. year old female who is a primary care patient of Alba Cory, MD.  [name] was referred to the pharmacist for assistance related to: DMII  Successful contact was made with the patient to discuss pharmacy services.  Patient declines engagement at this time. Contact information was provided to the patient should they wish to reach out for assistance at a later time.  [facility]  [facility], [facility] Care Management Assistant Direct Dial: [phone]  Fax: [phone]

## 2023-09-18 DIAGNOSIS — M5414 Radiculopathy, thoracic region: Secondary | ICD-10-CM | POA: Diagnosis not present

## 2023-09-18 DIAGNOSIS — M9902 Segmental and somatic dysfunction of thoracic region: Secondary | ICD-10-CM | POA: Diagnosis not present

## 2023-09-18 DIAGNOSIS — M62830 Muscle spasm of back: Secondary | ICD-10-CM | POA: Diagnosis not present

## 2023-09-18 DIAGNOSIS — M9901 Segmental and somatic dysfunction of cervical region: Secondary | ICD-10-CM | POA: Diagnosis not present

## 2023-10-02 ENCOUNTER — Other Ambulatory Visit: Payer: Self-pay | Admitting: Family Medicine

## 2023-10-02 DIAGNOSIS — E1169 Type 2 diabetes mellitus with other specified complication: Secondary | ICD-10-CM

## 2023-10-07 ENCOUNTER — Telehealth: Payer: Self-pay | Admitting: Family Medicine

## 2023-10-07 ENCOUNTER — Other Ambulatory Visit: Payer: Self-pay

## 2023-10-07 ENCOUNTER — Other Ambulatory Visit: Payer: Self-pay | Admitting: Family Medicine

## 2023-10-07 ENCOUNTER — Ambulatory Visit: Payer: Self-pay | Admitting: Family Medicine

## 2023-10-07 ENCOUNTER — Telehealth: Payer: Self-pay

## 2023-10-07 DIAGNOSIS — E1169 Type 2 diabetes mellitus with other specified complication: Secondary | ICD-10-CM

## 2023-10-07 MED ORDER — SOLIQUA 100-33 UNT-MCG/ML ~~LOC~~ SOPN: 50.0000 [IU] | PEN_INJECTOR | Freq: Every day | SUBCUTANEOUS | 0 refills | Status: DC

## 2023-10-07 NOTE — Telephone Encounter (Signed)
 Teed up

## 2023-10-07 NOTE — Telephone Encounter (Signed)
 Copied from CRM [id number]. Topic: Clinical - Prescription Issue >> Oct 07, 2023 12:39 PM Gery Pray wrote: Reason for CRM: Patient called to inform provider of the pharmacy that has her insulin in stock. Pharmacy is:   [facility] [address] [place], [place]

## 2023-10-07 NOTE — Telephone Encounter (Signed)
 Copied from CRM [id number]. Topic: Clinical - Medication Refill >> Oct 07, 2023  3:57 PM Gildardo Pounds wrote: Most Recent Primary Care Visit:  Provider: Alba Cory  Department: [facility]  Visit Type: OFFICE VISIT  Date: 07/10/2023  Medication: Insulin Glargine-Lixisenatide (SOLIQUA) 100-33 UNT-MCG/ML SOPN  Has the patient contacted their pharmacy? Yes (Agent: If no, request that the patient contact the pharmacy for the refill. If patient does not wish to contact the pharmacy document the reason why and proceed with request.) (Agent: If yes, when and what did the pharmacy advise?)Prescription was sent to wrong pharmacy  Is this the correct pharmacy for this prescription? Yes If no, delete pharmacy and type the correct one.  This is the patient's preferred pharmacy:  [id number] [place], [place] - [address] AT [facility] [address] [place] [address] Phone: [phone] Fax: [phone]  Has the prescription been filled recently? No  Is the patient out of the medication? Yes  Has the patient been seen for an appointment in the last year OR does the patient have an upcoming appointment? Yes  Can we respond through MyChart? Yes  Agent: Please be advised that Rx refills may take up to 3 business days. We ask that you follow-up with your pharmacy.

## 2023-10-07 NOTE — Telephone Encounter (Signed)
 Pt informed we do not have any samples and will need to contact pharmacies around the area to see who has it in stock. Pt states will calls [place] back once she finds a pharmacy that has it in stock.

## 2023-10-07 NOTE — Telephone Encounter (Signed)
  Chief Complaint: elevated blood sugar Symptoms: none Frequency: constant   Disposition: [] ED /[] Urgent Care (no appt availability in office) / [] Appointment(In office/virtual)/ []  [place] [facility]/ [] Home Care/ [] Refused Recommended Disposition /[] [facility]/ [x]  Follow-up with PCP Additional Notes: Pt calling asking for medication supplement until her medication comes in. Pt has been off [place] for a week. [facility] Pt wants med to be sent [facility]. Pharmacy told pt it should be in today. Pt is taking metformin and "little white pill- don't remember the name." Pt states her sugars are in the 200+ range. Per protocol, pt to be seen, but pt wants to hear from PCP if appt is needed.  Pt is denying all symptoms of hyperglycemia. Pt stated her eye is red and plans to call eye dr.  Theola Sequin gave care advice and pt verbalized understanding. Please advise.       Copied from CRM [id number]. Topic: Clinical - Red Word Triage >> Oct 07, 2023  9:15 AM Higinio Roger wrote: Red Word that prompted transfer to Nurse Triage: High Blood Sugar numbers  Pharmacy is out of medication. Patient has high blood sugar numbers ranging from 240-260. Patient also states her eye is red and has been out of medication (Insulin Glargine-Lixisenatide (SOLIQUA) 100-33 UNT-MCG/ML SOPN) for 1 week due to pharmacy not having it in stock Reason for Disposition  [1] Blood glucose > 240 mg/dL (04.5 mmol/L) AND [4] pregnant  Answer Assessment - Initial Assessment Questions 1. BLOOD GLUCOSE: "What is your blood glucose level?"      240-260 2. ONSET: "When did you check the blood glucose?"     This morning  3. USUAL RANGE: "What is your glucose level usually?" (e.g., usual fasting morning value, usual evening value)     90-low 100s 4. KETONES: "Do you check for ketones (urine or blood test strips)?" If Yes, ask: "What does the test show now?"      na 5. TYPE 1 or 2:  "Do you know what type  of diabetes you have?"  (e.g., Type 1, Type 2, Gestational; doesn't know)      Type 2 6. INSULIN: "Do you take insulin?" "What type of insulin(s) do you use? What is the mode of delivery? (syringe, pen; injection or pump)?"      Soliqua  7. DIABETES PILLS: "Do you take any pills for your diabetes?" If Yes, ask: "Have you missed taking any pills recently?"     Metformin  8. OTHER SYMPTOMS: "Do you have any symptoms?" (e.g., fever, frequent urination, difficulty breathing, dizziness, weakness, vomiting)     Denies all  Protocols used: Diabetes - High Blood Sugar-A-AH

## 2023-10-07 NOTE — Telephone Encounter (Signed)
 Copied from CRM [id number]. Topic: General - Call Back - No Documentation >> Oct 07, 2023  9:18 AM Higinio Roger wrote: Reason for CRM: Patient is requesting a callback to discuss if the office has any sample of: Insulin Glargine-Lixisenatide (SOLIQUA) 100-33 UNT-MCG/ML SOPN. The pharmacy is currently out and patient has been without medication for 1 week. Callback #: [phone]

## 2023-10-07 NOTE — Telephone Encounter (Signed)
 Copied from CRM [id number]. Topic: Clinical - Prescription Issue >> Oct 07, 2023  3:59 PM Gildardo Pounds wrote: Reason for CRM: Patient has been waiting at the pharmacy for 45 minutes to pick up her Insulin Glargine-Lixisenatide (SOLIQUA) 100-33 UNT-MCG/ML SOPN. Patient advised refill has been sent but that we cannot guarantee it will be filled today because it take 3 days for refills. Patient upset and requested to speak with Dr Carlynn Purl because she has been out of the medication for a week. Callback number is [phone]. Per CAL, nurse is unavailable to speak with patient. Patient advised.

## 2023-10-27 ENCOUNTER — Other Ambulatory Visit: Payer: Self-pay | Admitting: Family Medicine

## 2023-10-27 DIAGNOSIS — F325 Major depressive disorder, single episode, in full remission: Secondary | ICD-10-CM

## 2023-10-27 DIAGNOSIS — R232 Flushing: Secondary | ICD-10-CM

## 2023-11-13 ENCOUNTER — Encounter: Payer: Self-pay | Admitting: Family Medicine

## 2023-11-13 ENCOUNTER — Ambulatory Visit (INDEPENDENT_AMBULATORY_CARE_PROVIDER_SITE_OTHER): Payer: Self-pay | Admitting: Family Medicine

## 2023-11-13 ENCOUNTER — Ambulatory Visit: Payer: Medicare Other | Admitting: Family Medicine

## 2023-11-13 VITALS — BP 116/66 | HR 88 | Resp 16 | Ht 64.0 in | Wt 164.6 lb

## 2023-11-13 DIAGNOSIS — I10 Essential (primary) hypertension: Secondary | ICD-10-CM | POA: Diagnosis not present

## 2023-11-13 DIAGNOSIS — Z794 Long term (current) use of insulin: Secondary | ICD-10-CM

## 2023-11-13 DIAGNOSIS — E785 Hyperlipidemia, unspecified: Secondary | ICD-10-CM

## 2023-11-13 DIAGNOSIS — E1169 Type 2 diabetes mellitus with other specified complication: Secondary | ICD-10-CM

## 2023-11-13 DIAGNOSIS — L409 Psoriasis, unspecified: Secondary | ICD-10-CM

## 2023-11-13 DIAGNOSIS — E113213 Type 2 diabetes mellitus with mild nonproliferative diabetic retinopathy with macular edema, bilateral: Secondary | ICD-10-CM

## 2023-11-13 DIAGNOSIS — Z23 Encounter for immunization: Secondary | ICD-10-CM

## 2023-11-13 DIAGNOSIS — E538 Deficiency of other specified B group vitamins: Secondary | ICD-10-CM

## 2023-11-13 DIAGNOSIS — N951 Menopausal and female climacteric states: Secondary | ICD-10-CM | POA: Insufficient documentation

## 2023-11-13 DIAGNOSIS — E559 Vitamin D deficiency, unspecified: Secondary | ICD-10-CM

## 2023-11-13 LAB — POCT GLYCOSYLATED HEMOGLOBIN (HGB A1C): Hemoglobin A1C: 7.3 % — AB (ref 4.0–5.6)

## 2023-11-13 MED ORDER — PIOGLITAZONE HCL 15 MG PO TABS: 15.0000 mg | ORAL_TABLET | Freq: Every day | ORAL | 1 refills | Status: DC

## 2023-11-13 MED ORDER — ATORVASTATIN CALCIUM 40 MG PO TABS: 40.0000 mg | ORAL_TABLET | Freq: Every day | ORAL | 1 refills | Status: DC

## 2023-11-13 MED ORDER — VENLAFAXINE HCL ER 75 MG PO CP24: 75.0000 mg | ORAL_CAPSULE | Freq: Every day | ORAL | 1 refills | Status: DC

## 2023-11-13 MED ORDER — LOSARTAN POTASSIUM 50 MG PO TABS: 50.0000 mg | ORAL_TABLET | Freq: Every day | ORAL | 0 refills | Status: DC

## 2023-11-13 MED ORDER — SOLIQUA 100-33 UNT-MCG/ML ~~LOC~~ SOPN: 50.0000 [IU] | PEN_INJECTOR | Freq: Every day | SUBCUTANEOUS | 3 refills | Status: DC

## 2023-11-13 MED ORDER — METFORMIN HCL ER 750 MG PO TB24: 750.0000 mg | ORAL_TABLET | Freq: Two times a day (BID) | ORAL | 1 refills | Status: DC
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
## 2023-11-14 ENCOUNTER — Encounter: Payer: Self-pay | Admitting: Family Medicine

## 2023-11-14 LAB — CBC WITH DIFFERENTIAL/PLATELET
Absolute Lymphocytes: 1508 {cells}/uL (ref 850–3900)
Absolute Monocytes: 596 {cells}/uL (ref 200–950)
Basophils Absolute: 40 {cells}/uL (ref 0–200)
Basophils Relative: 0.6 %
Eosinophils Absolute: 168 {cells}/uL (ref 15–500)
Eosinophils Relative: 2.5 %
HCT: 35.1 % (ref 35.0–45.0)
Hemoglobin: 11.6 g/dL — ABNORMAL LOW (ref 11.7–15.5)
MCH: 30.1 pg (ref 27.0–33.0)
MCHC: 33 g/dL (ref 32.0–36.0)
MCV: 90.9 fL (ref 80.0–100.0)
MPV: 10.4 fL (ref 7.5–12.5)
Monocytes Relative: 8.9 %
Neutro Abs: 4389 {cells}/uL (ref 1500–7800)
Neutrophils Relative %: 65.5 %
Platelets: 354 10*3/uL (ref 140–400)
RBC: 3.86 10*6/uL (ref 3.80–5.10)
RDW: 12.9 % (ref 11.0–15.0)
Total Lymphocyte: 22.5 %
WBC: 6.7 10*3/uL (ref 3.8–10.8)

## 2023-11-14 LAB — LIPID PANEL
Cholesterol: 115 mg/dL (ref <200)
HDL: 44 mg/dL — ABNORMAL LOW (ref >=50)
LDL Cholesterol (Calc): 49 mg/dL
Non-HDL Cholesterol (Calc): 71 mg/dL (ref <130)
Total CHOL/HDL Ratio: 2.6 (calc) (ref <5.0)
Triglycerides: 138 mg/dL (ref <150)

## 2023-11-14 LAB — COMPREHENSIVE METABOLIC PANEL WITH GFR
AG Ratio: 1.9 (calc) (ref 1.0–2.5)
ALT: 13 U/L (ref 6–29)
AST: 13 U/L (ref 10–35)
Albumin: 4.2 g/dL (ref 3.6–5.1)
Alkaline phosphatase (APISO): 71 U/L (ref 37–153)
BUN: 21 mg/dL (ref 7–25)
CO2: 30 mmol/L (ref 20–32)
Calcium: 9.4 mg/dL (ref 8.6–10.4)
Chloride: 102 mmol/L (ref 98–110)
Creat: 0.74 mg/dL (ref 0.50–1.05)
Globulin: 2.2 g/dL (ref 1.9–3.7)
Glucose, Bld: 196 mg/dL — ABNORMAL HIGH (ref 65–99)
Potassium: 3.4 mmol/L — ABNORMAL LOW (ref 3.5–5.3)
Sodium: 139 mmol/L (ref 135–146)
Total Bilirubin: 0.3 mg/dL (ref 0.2–1.2)
Total Protein: 6.4 g/dL (ref 6.1–8.1)
eGFR: 88 mL/min/{1.73_m2} (ref >=60)

## 2023-11-14 LAB — B12 AND FOLATE PANEL
Folate: 16.1 ng/mL
Vitamin B-12: 1739 pg/mL — ABNORMAL HIGH (ref 200–1100)

## 2023-11-14 LAB — VITAMIN D 25 HYDROXY (VIT D DEFICIENCY, FRACTURES): Vit D, 25-Hydroxy: 60 ng/mL (ref 30–100)

## 2023-11-27 ENCOUNTER — Ambulatory Visit

## 2023-11-27 VITALS — BP 130/74

## 2023-11-27 DIAGNOSIS — I10 Essential (primary) hypertension: Secondary | ICD-10-CM

## 2023-11-27 NOTE — Progress Notes (Signed)
 Patient is in office today for a nurse visit for Blood Pressure Check. Patient blood pressure was 130/74, Patient No chest pain, No shortness of breath, No dyspnea on exertion, No orthopnea, No paroxysmal nocturnal dyspnea, No edema, No palpitations, No syncope, No Leg cramps. Pt has been compliant with medication.   Previous OV note: Hypertension Blood pressure low, likely due to weight loss and HCTZ. Losartan  suggested for renal protection. - Discontinue hydrochlorothiazide . - Initiate losartan  50 mg daily. - Schedule blood pressure check in 2 weeks.

## 2023-12-17 ENCOUNTER — Ambulatory Visit

## 2023-12-17 VITALS — BP 118/66

## 2023-12-17 DIAGNOSIS — Z0130 Encounter for examination of blood pressure without abnormal findings: Secondary | ICD-10-CM

## 2023-12-17 NOTE — Progress Notes (Signed)
 Patient is in office today for a nurse visit for Blood Pressure Check. Patient blood pressure was 118/66, Patient No chest pain, No shortness of breath, No dyspnea on exertion

## 2023-12-24 ENCOUNTER — Ambulatory Visit: Payer: Self-pay

## 2023-12-24 ENCOUNTER — Ambulatory Visit

## 2023-12-24 NOTE — Telephone Encounter (Signed)
 Left detailed vm

## 2023-12-24 NOTE — Telephone Encounter (Signed)
 Chief Complaint: information only  Disposition: [] ED /[] Urgent Care (no appt availability in office) / [] Appointment(In office/virtual)/ []  [facility]/ [] Home Care/ [] Refused Recommended Disposition /[] [facility]/ [x]  Follow-up with PCP  Additional Notes: Pt states that she is calling in to give her BP reading states it was 163/85 HR 81 earlier today and now its 135/63 HR 79.   Copied from CRM [id number]. Topic: Clinical - Red Word Triage >> Dec 24, 2023  2:39 PM Alpha Arts wrote: Red Word that prompted transfer to Nurse Triage: High Blood Pressure. Reading: 163/85 Pulse 81 Reason for Disposition . [1] Caller requesting NON-URGENT health information AND [2] PCP's office is the best resource  Protocols used: Information Only Call - No Triage-A-AH

## 2023-12-28 LAB — HM DIABETES EYE EXAM

## 2024-01-01 ENCOUNTER — Ambulatory Visit

## 2024-01-10 LAB — HM DIABETES EYE EXAM

## 2024-02-10 ENCOUNTER — Other Ambulatory Visit: Payer: Self-pay | Admitting: Family Medicine

## 2024-02-10 ENCOUNTER — Other Ambulatory Visit: Payer: Self-pay

## 2024-02-10 ENCOUNTER — Telehealth: Payer: Self-pay

## 2024-02-10 DIAGNOSIS — I10 Essential (primary) hypertension: Secondary | ICD-10-CM

## 2024-02-10 MED ORDER — LOSARTAN POTASSIUM 50 MG PO TABS: 50.0000 mg | ORAL_TABLET | Freq: Every day | ORAL | 0 refills | Status: DC

## 2024-02-10 NOTE — Telephone Encounter (Signed)
 Copied from CRM [id number]. Topic: Clinical - Medication Question >> Feb 10, 2024 10:29 AM Mesmerise C wrote: Reason for CRM: Patient was inquiring why losartan  (COZAAR ) 50 MG tablet is being denied was told by pharmacy please give call back at [phone]

## 2024-02-10 NOTE — Telephone Encounter (Signed)
 Pt rx request was denied because she has not scheduled her regular follow up around 03/14/2024. Once scheduled we can fill rx.

## 2024-03-26 ENCOUNTER — Ambulatory Visit (INDEPENDENT_AMBULATORY_CARE_PROVIDER_SITE_OTHER): Admitting: Family Medicine

## 2024-03-26 ENCOUNTER — Encounter: Payer: Self-pay | Admitting: Family Medicine

## 2024-03-26 ENCOUNTER — Other Ambulatory Visit: Payer: Self-pay

## 2024-03-26 VITALS — BP 124/72 | HR 84 | Temp 98.2°F | Resp 18 | Ht 64.0 in | Wt 169.1 lb

## 2024-03-26 DIAGNOSIS — M170 Bilateral primary osteoarthritis of knee: Secondary | ICD-10-CM

## 2024-03-26 DIAGNOSIS — I152 Hypertension secondary to endocrine disorders: Secondary | ICD-10-CM

## 2024-03-26 DIAGNOSIS — D649 Anemia, unspecified: Secondary | ICD-10-CM

## 2024-03-26 DIAGNOSIS — E1159 Type 2 diabetes mellitus with other circulatory complications: Secondary | ICD-10-CM | POA: Diagnosis not present

## 2024-03-26 DIAGNOSIS — E1169 Type 2 diabetes mellitus with other specified complication: Secondary | ICD-10-CM

## 2024-03-26 DIAGNOSIS — Z8349 Family history of other endocrine, nutritional and metabolic diseases: Secondary | ICD-10-CM

## 2024-03-26 DIAGNOSIS — Z794 Long term (current) use of insulin: Secondary | ICD-10-CM | POA: Diagnosis not present

## 2024-03-26 DIAGNOSIS — L409 Psoriasis, unspecified: Secondary | ICD-10-CM

## 2024-03-26 DIAGNOSIS — R600 Localized edema: Secondary | ICD-10-CM

## 2024-03-26 DIAGNOSIS — E113213 Type 2 diabetes mellitus with mild nonproliferative diabetic retinopathy with macular edema, bilateral: Secondary | ICD-10-CM

## 2024-03-26 DIAGNOSIS — E785 Hyperlipidemia, unspecified: Secondary | ICD-10-CM

## 2024-03-26 LAB — POCT GLYCOSYLATED HEMOGLOBIN (HGB A1C): Hemoglobin A1C: 6.4 % — AB (ref 4.0–5.6)

## 2024-03-26 MED ORDER — METFORMIN HCL ER 750 MG PO TB24: 750.0000 mg | ORAL_TABLET | Freq: Every day | ORAL | 0 refills | Status: DC
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
## 2024-03-27 ENCOUNTER — Ambulatory Visit: Payer: Self-pay | Admitting: Family Medicine

## 2024-03-27 LAB — IRON,TIBC AND FERRITIN PANEL
%SAT: 16 % (ref 16–45)
Ferritin: 19 ng/mL (ref 16–288)
Iron: 58 ng/mL (ref 45–288)
TIBC: 373 ug/dL (ref 250–450)

## 2024-03-27 LAB — COMPREHENSIVE METABOLIC PANEL WITH GFR
AG Ratio: 1.8 (calc) (ref 1.0–2.5)
ALT: 14 U/L (ref 6–29)
AST: 12 U/L (ref 10–35)
Albumin: 4.3 g/dL (ref 3.6–5.1)
Alkaline phosphatase (APISO): 81 U/L (ref 37–153)
BUN: 20 mg/dL (ref 7–25)
CO2: 28 mmol/L (ref 20–32)
Calcium: 9.9 mg/dL (ref 8.6–10.4)
Chloride: 105 mmol/L (ref 98–110)
Creat: 0.64 mg/dL (ref 0.50–1.05)
Globulin: 2.4 g/dL (ref 1.9–3.7)
Glucose, Bld: 77 mg/dL (ref 65–99)
Potassium: 4 mmol/L (ref 3.5–5.3)
Sodium: 142 mmol/L (ref 135–146)
Total Bilirubin: 0.3 mg/dL (ref 0.2–1.2)
Total Protein: 6.7 g/dL (ref 6.1–8.1)
eGFR: 96 mL/min/1.73m2 (ref >=60)

## 2024-03-27 LAB — CBC WITH DIFFERENTIAL/PLATELET
Absolute Lymphocytes: 1096 {cells}/uL (ref 850–3900)
Absolute Monocytes: 487 {cells}/uL (ref 200–950)
Basophils Absolute: 41 {cells}/uL (ref 0–200)
Basophils Relative: 0.7 %
Eosinophils Absolute: 261 {cells}/uL (ref 15–500)
Eosinophils Relative: 4.5 %
HCT: 35.3 % (ref 35.0–45.0)
Hemoglobin: 11.6 g/dL — ABNORMAL LOW (ref 11.7–15.5)
MCH: 30.6 pg (ref 27.0–33.0)
MCHC: 32.9 g/dL (ref 32.0–36.0)
MCV: 93.1 fL (ref 80.0–100.0)
MPV: 9.9 fL (ref 7.5–12.5)
Monocytes Relative: 8.4 %
Neutro Abs: 3915 {cells}/uL (ref 1500–7800)
Neutrophils Relative %: 67.5 %
Platelets: 340 Thousand/uL (ref 140–400)
RBC: 3.79 Million/uL — ABNORMAL LOW (ref 3.80–5.10)
RDW: 13.1 % (ref 11.0–15.0)
Total Lymphocyte: 18.9 %
WBC: 5.8 Thousand/uL (ref 3.8–10.8)

## 2024-03-27 LAB — MICROALBUMIN / CREATININE URINE RATIO
Creatinine, Urine: 90 mg/dL (ref 20–275)
Microalb Creat Ratio: 6 mg/g{creat} (ref <30)
Microalb, Ur: 0.5 mg/dL

## 2024-03-27 LAB — VITAMIN B12: Vitamin B-12: 1079 pg/mL (ref 200–1100)

## 2024-03-30 ENCOUNTER — Other Ambulatory Visit: Payer: Self-pay | Admitting: Family Medicine

## 2024-03-30 ENCOUNTER — Telehealth (INDEPENDENT_AMBULATORY_CARE_PROVIDER_SITE_OTHER): Payer: Self-pay

## 2024-03-30 DIAGNOSIS — R600 Localized edema: Secondary | ICD-10-CM

## 2024-03-30 NOTE — Telephone Encounter (Signed)
 ATC both numbers listed in chart. No answer and no VM set up for either to LM.   Patient has an order for a DVT to be scheduled ASAP.

## 2024-04-03 ENCOUNTER — Ambulatory Visit (INDEPENDENT_AMBULATORY_CARE_PROVIDER_SITE_OTHER)

## 2024-04-03 DIAGNOSIS — R600 Localized edema: Secondary | ICD-10-CM

## 2024-04-07 ENCOUNTER — Ambulatory Visit: Payer: Self-pay | Admitting: Family Medicine

## 2024-05-09 ENCOUNTER — Other Ambulatory Visit: Payer: Self-pay | Admitting: Family Medicine

## 2024-05-09 DIAGNOSIS — I10 Essential (primary) hypertension: Secondary | ICD-10-CM

## 2024-05-22 ENCOUNTER — Other Ambulatory Visit: Payer: Self-pay | Admitting: Family Medicine

## 2024-05-22 DIAGNOSIS — E1169 Type 2 diabetes mellitus with other specified complication: Secondary | ICD-10-CM

## 2024-05-22 NOTE — Telephone Encounter (Unsigned)
 Copied from CRM [id number]. Topic: Clinical - Medication Refill >> May 22, 2024  3:37 PM Lauren C wrote: Medication: Insulin  Glargine-Lixisenatide (SOLIQUA ) 100-33 UNT-MCG/ML SOPN  Has the patient contacted their pharmacy? Yes No refills  This is the patient's preferred pharmacy:  [id number] - [place], [place] - [address] [[id number]]  Is this the correct pharmacy for this prescription? Yes If no, delete pharmacy and type the correct one.   Has the prescription been filled recently? Yes  Is the patient out of the medication? Yes  Has the patient been seen for an appointment in the last year OR does the patient have an upcoming appointment? Yes  Can we respond through MyChart? Yes  Agent: Please be advised that Rx refills may take up to 3 business days. We ask that you follow-up with your pharmacy.

## 2024-05-25 ENCOUNTER — Telehealth: Payer: Self-pay | Admitting: Family Medicine

## 2024-05-25 ENCOUNTER — Telehealth: Payer: Self-pay

## 2024-05-25 DIAGNOSIS — E1169 Type 2 diabetes mellitus with other specified complication: Secondary | ICD-10-CM

## 2024-05-25 MED ORDER — SOLIQUA 100-33 UNT-MCG/ML ~~LOC~~ SOPN: 50.0000 [IU] | PEN_INJECTOR | Freq: Every day | SUBCUTANEOUS | 0 refills | Status: DC

## 2024-05-25 NOTE — Telephone Encounter (Signed)
 Requested Prescriptions  Pending Prescriptions Disp Refills   Insulin  Glargine-Lixisenatide (SOLIQUA ) 100-33 UNT-MCG/ML SOPN 15 mL 0    Sig: Inject 50 Units into the skin daily at 12 noon.     Endocrinology: Diabetes - Insulin  + GLP-1 Receptor Agonist Combos 2 Passed - 05/25/2024  4:09 PM      Passed - HBA1C is between 0 and 7.9 and within 180 days    Hemoglobin A1C  Date Value Ref Range Status  03/26/2024 6.4 (A) 4.0 - 5.6 % Final   HbA1c, POC (controlled diabetic range)  Date Value Ref Range Status  02/11/2019 6.7 0.0 - 7.0 % Final   Hgb A1c MFr Bld  Date Value Ref Range Status  10/10/2021 8.7 (H) <5.7 % of total Hgb Final    Comment:    For someone without known diabetes, a hemoglobin A1c value of 6.5% or greater indicates that they may have  diabetes and this should be confirmed with a follow-up  test. . For someone with known diabetes, a value <7% indicates  that their diabetes is well controlled and a value  greater than or equal to 7% indicates suboptimal  control. A1c targets should be individualized based on  duration of diabetes, age, comorbid conditions, and  other considerations. . Currently, no consensus exists regarding use of hemoglobin A1c for diagnosis of diabetes for children. .          Passed - Cr in normal range and within 360 days    Creat  Date Value Ref Range Status  03/26/2024 0.64 0.50 - 1.05 mg/dL Final   Creatinine, Urine  Date Value Ref Range Status  03/26/2024 90 20 - 275 mg/dL Final         Passed - eGFR in normal range and within 360 days    GFR, Est African American  Date Value Ref Range Status  12/23/2020 95 > OR = 60 mL/min/1.35m2 Final   GFR, Est Non African American  Date Value Ref Range Status  12/23/2020 82 > OR = 60 mL/min/1.52m2 Final   eGFR  Date Value Ref Range Status  03/26/2024 96 > OR = 60 mL/min/1.11m2 Final         Passed - Valid encounter within last 6 months    Recent Outpatient Visits           2  months ago 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

## 2024-05-25 NOTE — Telephone Encounter (Signed)
 Pt was able to get it from a differnet location but she says she always has trouble every month with getting her insulin , please advice once you hear from pharmacist. Pt ok with doing doing alternative if needed. FYI

## 2024-05-25 NOTE — Telephone Encounter (Signed)
 Copied from CRM #[id number]. Topic: Clinical - Medication Question >> May 22, 2024  3:42 PM Lauren C wrote: Reason for CRM: Pt has had a lot of trouble finding pharmacies that have the Soliqua  insulin  in stock. She is wondering if there would be a better brand so she can have an easier time getting it because she keeps having this problem. Currently without insulin  1 week now.

## 2024-05-25 NOTE — Telephone Encounter (Signed)
 Copied from CRM #[id number]. Topic: General - Call Back - No Documentation >> May 25, 2024  3:27 PM Precious C wrote: Reason for CRM: Patient called in stating that she has tried numerous times to reach someone regarding her insulin  medication but has not received a response. She is requesting a call back from the clinic to discuss her insulin , Soliqua .   Callback: Elide, Stalzer  [phone]

## 2024-05-26 ENCOUNTER — Telehealth: Payer: Self-pay

## 2024-05-26 ENCOUNTER — Other Ambulatory Visit: Payer: Self-pay | Admitting: Family Medicine

## 2024-05-26 ENCOUNTER — Other Ambulatory Visit (HOSPITAL_COMMUNITY): Payer: Self-pay

## 2024-05-26 NOTE — Telephone Encounter (Signed)
 Pharmacy Patient Advocate Encounter  Insurance verification completed.    The patient is insured through [employer]. Patient has Medicare and is not eligible for a copay card, but may be able to apply for patient assistance or Medicare RX Payment Plan (Patient Must reach out to their plan, if eligible for payment plan), if available.    Ran test claim for Lantus Solostar and the current 30 day co-pay is $35.00.  Ran test claim for Ozempic and the current 30 day co-pay is $302.00, $47.00 after deductible is met   This test claim was processed through [facility]- copay amounts may vary at other pharmacies due to [employer], or as the patient moves through the different stages of their insurance plan.

## 2024-05-27 ENCOUNTER — Other Ambulatory Visit: Payer: Self-pay

## 2024-05-31 ENCOUNTER — Other Ambulatory Visit: Payer: Self-pay | Admitting: Family Medicine

## 2024-06-01 ENCOUNTER — Other Ambulatory Visit: Payer: Self-pay | Admitting: Family Medicine

## 2024-06-01 MED ORDER — IPRATROPIUM BROMIDE 0.03 % NA SOLN: 2.0000 | Freq: Two times a day (BID) | NASAL | 0 refills | Status: DC

## 2024-06-01 NOTE — Telephone Encounter (Signed)
 Pt requesting a refill on ipratropium (ATROVENT) 0.03 % nasal spray asking that it be sent to [facility]

## 2024-06-01 NOTE — Telephone Encounter (Signed)
 Historical proivder

## 2024-06-05 NOTE — Telephone Encounter (Signed)
 Brief Telephone Documentation Summary of Call: Called patient to discuss stock issues with Soliqua  therapy. Left message with direct line for patient to call back at earliest convenience.  Will try again at a later date.   Lovis More E. Marsh, PharmD Clinical Pharmacist [facility] [phone]

## 2024-06-08 ENCOUNTER — Other Ambulatory Visit: Payer: Self-pay

## 2024-06-08 MED ORDER — SOLIQUA 100-33 UNT-MCG/ML ~~LOC~~ SOPN
50.0000 [IU] | PEN_INJECTOR | Freq: Every day | SUBCUTANEOUS | 2 refills | Status: AC
  Filled 2024-06-08 – 2024-06-29 (×2): qty 15, 30d supply, fill #0
  Filled 2024-08-13: qty 15, 30d supply, fill #1

## 2024-06-08 NOTE — Telephone Encounter (Signed)
 Brief Telephone Documentation Contacted patient to discuss pharmacy stock issues with Soliqua . Explained that she will likely have better luck month to month obtaining through [facility] because of their off-site distribution center. Patient would like to obtain Soliqua  via [facility] for future medication fills.  Follow Up: Patient given direct line for further questions/concerns.  Ashan Cueva E. Marsh, PharmD Clinical Pharmacist [facility] [phone]

## 2024-06-08 NOTE — Addendum Note (Signed)
 Addended by: MARSH, Kadee Philyaw E on: 06/08/2024 08:21 AM   Modules accepted: Orders

## 2024-06-10 ENCOUNTER — Other Ambulatory Visit: Payer: Self-pay | Admitting: Family Medicine

## 2024-06-11 ENCOUNTER — Ambulatory Visit: Payer: Medicare Other

## 2024-06-11 DIAGNOSIS — Z780 Asymptomatic menopausal state: Secondary | ICD-10-CM | POA: Diagnosis not present

## 2024-06-11 DIAGNOSIS — Z0000 Encounter for general adult medical examination without abnormal findings: Secondary | ICD-10-CM

## 2024-06-11 DIAGNOSIS — Z1231 Encounter for screening mammogram for malignant neoplasm of breast: Secondary | ICD-10-CM
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
## 2024-06-11 NOTE — Patient Instructions (Addendum)
 [name],  Thank you for taking the time for your Medicare Wellness Visit. I appreciate your continued commitment to your health goals. Please review the care plan we discussed, and feel free to reach out if I can assist you further.  Please note that Annual Wellness Visits do not include a physical exam. Some assessments may be limited, especially if the visit was conducted virtually. If needed, we may recommend an in-person follow-up with your provider.  Ongoing Care Seeing your primary care provider every 3 to 6 months helps us  monitor your health and provide consistent, personalized care.   Referrals If a referral was made during today's visit and you haven't received any updates within two weeks, please contact the referred provider directly to check on the status.  REFERRALS SENT FOR MAMMOGRAM & BONE DENSITY SCAN You have an order for:  []   2D Mammogram  [x]   3D Mammogram  [x]   Bone Density     Please call for appointment:  [facility]  [address] [place] [address] [phone] [facility] [address] [place], [place] [address] [phone] [facility] [address] [place], [place] [address] [phone]   Make sure to wear two-piece clothing.  No lotions, powders, or deodorants the day of the appointment. Make sure to bring picture ID and insurance card.  Bring list of medications you are currently taking including any supplements.   Schedule your [place] screening mammogram through MyChart!   Log into your MyChart account.  Go to 'Visit' (or 'Appointments' if on mobile App) --> Schedule an Appointment  Under 'Select a Reason for Visit' choose the Mammogram Screening option.  Complete the pre-visit questions and select the time and place that best fits your schedule.   Recommended Screenings:  Health Maintenance  Topic  Date Due   COVID-19 Vaccine (6 - 2025-26 season) 04/06/2024   Complete foot exam   07/09/2024   Breast Cancer Screening  08/14/2024   Hemoglobin A1C  09/26/2024   Eye exam for diabetics  01/09/2025   Yearly kidney function blood test for diabetes  03/26/2025   Yearly kidney health urinalysis for diabetes  03/26/2025   Medicare Annual Wellness Visit  06/11/2025   Colon Cancer Screening  09/07/2029   DTaP/Tdap/Td vaccine (3 - Td or Tdap) 03/29/2030   Pneumococcal Vaccine for age over 50  Completed   Flu Shot  Completed   DEXA scan (bone density measurement)  Completed   Hepatitis C Screening  Completed   Zoster (Shingles) Vaccine  Completed   Meningitis B Vaccine  Aged Out    Vision: Annual vision screenings are recommended for early detection of glaucoma, cataracts, and diabetic retinopathy. These exams can also reveal signs of chronic conditions such as diabetes and high blood pressure.  Dental: Annual dental screenings help detect early signs of oral cancer, gum disease, and other conditions linked to overall health, including heart disease and diabetes.  Please see the attached documents for additional preventive care recommendations.   NEXT AWV 06/17/25 @ 1:20 PM IN PERSON

## 2024-06-29 ENCOUNTER — Other Ambulatory Visit: Payer: Self-pay

## 2024-06-30 ENCOUNTER — Other Ambulatory Visit: Payer: Self-pay

## 2024-07-11 ENCOUNTER — Other Ambulatory Visit: Payer: Self-pay | Admitting: Family Medicine

## 2024-07-14 NOTE — Telephone Encounter (Signed)
 Requested medications are due for refill today.  yes  Requested medications are on the active medications list.  yes  Last refill. 06/01/2024 30mL 0 rf  Future visit scheduled.   yes  Notes to clinic.  Medication not assigned to a protocol. Please review for refill.    Requested Prescriptions  Pending Prescriptions Disp Refills   ipratropium (ATROVENT ) 0.03 % nasal spray [Pharmacy Med Name: IPRATROPIUM 0.03% SPRAY]      Sig: Place 2 sprays into both nostrils 2 (two) times daily.     Off-Protocol Failed - 07/14/2024 11:10 AM      Failed - Medication not assigned to a protocol, review manually.      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 Sowles, Krichna, MD

## 2024-07-16 ENCOUNTER — Other Ambulatory Visit: Payer: Self-pay | Admitting: Family Medicine

## 2024-07-16 DIAGNOSIS — E1169 Type 2 diabetes mellitus with other specified complication: Secondary | ICD-10-CM

## 2024-07-17 NOTE — Telephone Encounter (Signed)
 Requested Prescriptions  Pending Prescriptions Disp Refills   atorvastatin  (LIPITOR) 40 MG tablet [Pharmacy Med Name: ATORVASTATIN  40 MG TABLET] 90 tablet 1    Sig: TAKE 1 TABLET BY MOUTH EVERY DAY     Cardiovascular:  Antilipid - Statins Failed - 07/17/2024  1:57 PM      Failed - Lipid Panel in normal range within the last 12 months    Cholesterol  Date Value Ref Range Status  11/13/2023 115 <200 mg/dL Final  93/87/7984 846 0 - 200 mg/dL Final   Ldl Cholesterol, Calc  Date Value Ref Range Status  01/15/2014 88 0 - 100 mg/dL Final   LDL Cholesterol (Calc)  Date Value Ref Range Status  11/13/2023 49 mg/dL (calc) Final    Comment:    Reference range: <100 . Desirable range <100 mg/dL for primary prevention;   <70 mg/dL for patients with CHD or diabetic patients  with > or = 2 CHD risk factors. SABRA LDL-C is now calculated using the Martin-Hopkins  calculation, which is a validated novel method providing  better accuracy than the Friedewald equation in the  estimation of LDL-C.  Gladis APPLETHWAITE et al. SANDREA. 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

## 2024-07-22 ENCOUNTER — Other Ambulatory Visit: Payer: Self-pay | Admitting: Family Medicine

## 2024-07-22 DIAGNOSIS — E113213 Type 2 diabetes mellitus with mild nonproliferative diabetic retinopathy with macular edema, bilateral: Secondary | ICD-10-CM

## 2024-07-22 DIAGNOSIS — E1169 Type 2 diabetes mellitus with other specified complication: Secondary | ICD-10-CM

## 2024-07-24 NOTE — Telephone Encounter (Signed)
 Requested Prescriptions  Pending Prescriptions Disp Refills   metFORMIN  (GLUCOPHAGE -XR) 750 MG 24 hr tablet [Pharmacy Med Name: METFORMIN  HCL ER 750 MG TABLET] 90 tablet 0    Sig: TAKE 1 TABLET BY MOUTH EVERY DAY WITH BREAKFAST     Endocrinology:  Diabetes - Biguanides Passed - 07/24/2024  1:26 PM      Passed - Cr in normal range and within 360 days    Creat  Date Value Ref Range Status  03/26/2024 0.64 0.50 - 1.05 mg/dL Final   Creatinine, Urine  Date Value Ref Range Status  03/26/2024 90 20 - 275 mg/dL Final         Passed - HBA1C is between 0 and 7.9 and within 180 days    Hemoglobin A1C  Date Value Ref Range Status  03/26/2024 6.4 (A) 4.0 - 5.6 % Final   HbA1c, POC (controlled diabetic range)  Date Value Ref Range Status  02/11/2019 6.7 0.0 - 7.0 % Final   Hgb A1c MFr Bld  Date Value Ref Range Status  10/10/2021 8.7 (H) <5.7 % of total Hgb Final    Comment:    For someone without known diabetes, a hemoglobin A1c value of 6.5% or greater indicates that they may have  diabetes and this should be confirmed with a follow-up  test. . For someone with known diabetes, a value <7% indicates  that their diabetes is well controlled and a value  greater than or equal to 7% indicates suboptimal  control. A1c targets should be individualized based on  duration of diabetes, age, comorbid conditions, and  other considerations. . Currently, no consensus exists regarding use of hemoglobin A1c for diagnosis of diabetes for children. .          Passed - eGFR in normal range and within 360 days    GFR, Est African American  Date Value Ref Range Status  12/23/2020 95 > OR = 60 mL/min/1.71m2 Final   GFR, Est Non African American  Date Value Ref Range Status  12/23/2020 82 > OR = 60 mL/min/1.32m2 Final   eGFR  Date Value Ref Range Status  03/26/2024 96 > OR = 60 mL/min/1.75m2 Final         Passed - B12 Level in normal range and within 720 days    Vitamin B-12  Date Value  Ref Range Status  03/26/2024 1,079 200 - 1,100 pg/mL Final         Passed - Valid encounter within last 6 months    Recent Outpatient Visits           4 months ago 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, with long-term current use of insulin  [facility])   [facility] Garrison, Dorette, MD   8 months ago Dyslipidemia associated with type 2 diabetes mellitus [facility])   [facility] Brogden, Dorette, MD              Passed - CBC within normal limits and completed in the last 12 months    WBC  Date Value Ref Range Status  03/26/2024 5.8 3.8 - 10.8 Thousand/uL Final   RBC  Date Value Ref Range Status  03/26/2024 3.79 (L) 3.80 - 5.10 Million/uL Final   Hemoglobin  Date Value Ref Range Status  03/26/2024 11.6 (L) 11.7 - 15.5 g/dL Final   HCT  Date Value Ref Range Status  03/26/2024 35.3 35.0 - 45.0 % Final  MCHC  Date Value Ref Range Status  03/26/2024 32.9 32.0 - 36.0 g/dL Final    Comment:    For adults, a slight decrease in the calculated MCHC value (in the range of 30 to 32 g/dL) is most likely not clinically significant; however, it should be interpreted with caution in correlation with other red cell parameters and the patient's clinical condition.    [facility]  Date Value Ref Range Status  03/26/2024 30.6 27.0 - 33.0 pg Final   MCV  Date Value Ref Range Status  03/26/2024 93.1 80.0 - 100.0 fL Final   No results found for: PLTCOUNTKUC, LABPLAT, POCPLA RDW  Date Value Ref Range Status  03/26/2024 13.1 11.0 - 15.0 % Final          pioglitazone  (ACTOS ) 15 MG tablet [Pharmacy Med Name: PIOGLITAZONE  HCL 15 MG TABLET] 90 tablet 0    Sig: TAKE 1 TABLET (15 MG TOTAL) BY MOUTH DAILY.     Endocrinology:  Diabetes - Glitazones - pioglitazone  Passed - 07/24/2024  1:26 PM      Passed - HBA1C is between 0 and 7.9 and within 180 days    Hemoglobin A1C  Date  Value Ref Range Status  03/26/2024 6.4 (A) 4.0 - 5.6 % Final   HbA1c, POC (controlled diabetic range)  Date Value Ref Range Status  02/11/2019 6.7 0.0 - 7.0 % Final   Hgb A1c MFr Bld  Date Value Ref Range Status  10/10/2021 8.7 (H) <5.7 % of total Hgb Final    Comment:    For someone without known diabetes, a hemoglobin A1c value of 6.5% or greater indicates that they may have  diabetes and this should be confirmed with a follow-up  test. . For someone with known diabetes, a value <7% indicates  that their diabetes is well controlled and a value  greater than or equal to 7% indicates suboptimal  control. A1c targets should be individualized based on  duration of diabetes, age, comorbid conditions, and  other considerations. . Currently, no consensus exists regarding use of hemoglobin A1c for diagnosis of diabetes for children. 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

## 2024-08-13 ENCOUNTER — Ambulatory Visit: Admitting: Family Medicine

## 2024-08-13 ENCOUNTER — Encounter: Payer: Self-pay | Admitting: Family Medicine

## 2024-08-13 ENCOUNTER — Other Ambulatory Visit: Payer: Self-pay

## 2024-08-13 VITALS — BP 110/64 | HR 86 | Resp 16 | Ht 64.0 in | Wt 171.3 lb

## 2024-08-13 DIAGNOSIS — E1169 Type 2 diabetes mellitus with other specified complication: Secondary | ICD-10-CM

## 2024-08-13 DIAGNOSIS — E538 Deficiency of other specified B group vitamins: Secondary | ICD-10-CM

## 2024-08-13 DIAGNOSIS — I152 Hypertension secondary to endocrine disorders: Secondary | ICD-10-CM

## 2024-08-13 DIAGNOSIS — D649 Anemia, unspecified: Secondary | ICD-10-CM | POA: Insufficient documentation

## 2024-08-13 DIAGNOSIS — E113213 Type 2 diabetes mellitus with mild nonproliferative diabetic retinopathy with macular edema, bilateral: Secondary | ICD-10-CM

## 2024-08-13 DIAGNOSIS — F325 Major depressive disorder, single episode, in full remission: Secondary | ICD-10-CM

## 2024-08-13 DIAGNOSIS — L409 Psoriasis, unspecified: Secondary | ICD-10-CM

## 2024-08-13 DIAGNOSIS — E1159 Type 2 diabetes mellitus with other circulatory complications: Secondary | ICD-10-CM

## 2024-08-13 DIAGNOSIS — E785 Hyperlipidemia, unspecified: Secondary | ICD-10-CM

## 2024-08-13 DIAGNOSIS — E559 Vitamin D deficiency, unspecified: Secondary | ICD-10-CM

## 2024-08-13 DIAGNOSIS — Z794 Long term (current) use of insulin: Secondary | ICD-10-CM

## 2024-08-13 LAB — POCT GLYCOSYLATED HEMOGLOBIN (HGB A1C): Hemoglobin A1C: 6.5 % — AB (ref 4.0–5.6)

## 2024-08-13 MED ORDER — VENLAFAXINE HCL ER 75 MG PO CP24: 75.0000 mg | ORAL_CAPSULE | Freq: Every day | ORAL | 1 refills | Status: AC

## 2024-08-13 MED ORDER — METFORMIN HCL ER 750 MG PO TB24: 750.0000 mg | ORAL_TABLET | Freq: Every day | ORAL | 1 refills | Status: AC

## 2024-08-13 MED ORDER — PIOGLITAZONE HCL 15 MG PO TABS: 15.0000 mg | ORAL_TABLET | Freq: Every day | ORAL | 1 refills | Status: AC

## 2024-08-13 MED ORDER — LOSARTAN POTASSIUM 25 MG PO TABS: 25.0000 mg | ORAL_TABLET | Freq: Every day | ORAL | 1 refills | Status: AC
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
## 2024-08-19 ENCOUNTER — Encounter

## 2024-08-19 ENCOUNTER — Other Ambulatory Visit

## 2024-08-27 ENCOUNTER — Ambulatory Visit
Admission: RE | Admit: 2024-08-27 | Discharge: 2024-08-27 | Disposition: A | Discharge status: Home / Self Care | Source: Ambulatory Visit | Attending: Family Medicine | Admitting: Family Medicine

## 2024-08-27 DIAGNOSIS — Z1231 Encounter for screening mammogram for malignant neoplasm of breast: Secondary | ICD-10-CM | POA: Diagnosis present

## 2024-08-27 DIAGNOSIS — Z780 Asymptomatic menopausal state: Secondary | ICD-10-CM | POA: Insufficient documentation

## 2024-08-28 ENCOUNTER — Ambulatory Visit: Payer: Self-pay | Admitting: Family Medicine

## 2025-02-11 ENCOUNTER — Ambulatory Visit: Admitting: Family Medicine

## 2025-06-17 ENCOUNTER — Ambulatory Visit
